# Patient Record
Sex: Female | Born: 1960 | Race: White | Hispanic: No | Marital: Married | Smoking: Never smoker
Health system: Southern US, Community
[De-identification: ages and names within clinical notes are randomized; demographics above are authoritative.]

## PROBLEM LIST (undated history)

## (undated) DIAGNOSIS — T7840XA Allergy, unspecified, initial encounter: Secondary | ICD-10-CM

## (undated) DIAGNOSIS — Z8669 Personal history of other diseases of the nervous system and sense organs: Secondary | ICD-10-CM

## (undated) DIAGNOSIS — E785 Hyperlipidemia, unspecified: Secondary | ICD-10-CM

## (undated) DIAGNOSIS — I10 Essential (primary) hypertension: Secondary | ICD-10-CM

## (undated) HISTORY — DX: Hyperlipidemia, unspecified: E78.5

## (undated) HISTORY — DX: Essential (primary) hypertension: I10

## (undated) HISTORY — DX: Allergy, unspecified, initial encounter: T78.40XA

---
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
## 1996-06-29 HISTORY — PX: OTHER SURGICAL HISTORY: SHX169

## 1998-08-07 ENCOUNTER — Ambulatory Visit (HOSPITAL_COMMUNITY): Admission: RE | Admit: 1998-08-07 | Discharge: 1998-08-07 | Payer: Self-pay | Admitting: Internal Medicine

## 1998-08-07 ENCOUNTER — Encounter: Payer: Self-pay | Admitting: Internal Medicine

## 1999-07-01 ENCOUNTER — Other Ambulatory Visit: Admission: RE | Admit: 1999-07-01 | Discharge: 1999-07-01 | Payer: Self-pay | Admitting: Internal Medicine

## 2000-08-12 ENCOUNTER — Other Ambulatory Visit: Admission: RE | Admit: 2000-08-12 | Discharge: 2000-08-12 | Payer: Self-pay | Admitting: Internal Medicine

## 2000-08-30 ENCOUNTER — Ambulatory Visit (HOSPITAL_COMMUNITY): Admission: RE | Admit: 2000-08-30 | Discharge: 2000-08-30 | Payer: Self-pay | Admitting: Internal Medicine

## 2000-08-30 ENCOUNTER — Encounter: Payer: Self-pay | Admitting: Internal Medicine

## 2001-09-08 ENCOUNTER — Other Ambulatory Visit: Admission: RE | Admit: 2001-09-08 | Discharge: 2001-09-08 | Payer: Self-pay | Admitting: Internal Medicine

## 2001-09-21 ENCOUNTER — Encounter: Payer: Self-pay | Admitting: Internal Medicine

## 2001-09-21 ENCOUNTER — Ambulatory Visit (HOSPITAL_COMMUNITY): Admission: RE | Admit: 2001-09-21 | Discharge: 2001-09-21 | Payer: Self-pay | Admitting: Internal Medicine

## 2002-09-19 ENCOUNTER — Other Ambulatory Visit: Admission: RE | Admit: 2002-09-19 | Discharge: 2002-09-19 | Payer: Self-pay | Admitting: Surgery

## 2002-10-04 ENCOUNTER — Encounter: Payer: Self-pay | Admitting: Internal Medicine

## 2002-10-04 ENCOUNTER — Ambulatory Visit (HOSPITAL_COMMUNITY): Admission: RE | Admit: 2002-10-04 | Discharge: 2002-10-04 | Payer: Self-pay | Admitting: Internal Medicine

## 2002-10-10 ENCOUNTER — Encounter: Admission: RE | Admit: 2002-10-10 | Discharge: 2002-10-31 | Payer: Self-pay | Admitting: Internal Medicine

## 2003-09-20 ENCOUNTER — Other Ambulatory Visit: Admission: RE | Admit: 2003-09-20 | Discharge: 2003-09-20 | Payer: Self-pay

## 2003-10-16 ENCOUNTER — Ambulatory Visit (HOSPITAL_COMMUNITY): Admission: RE | Admit: 2003-10-16 | Discharge: 2003-10-16 | Payer: Self-pay | Admitting: Internal Medicine

## 2003-11-12 ENCOUNTER — Encounter: Admission: RE | Admit: 2003-11-12 | Discharge: 2004-02-10 | Payer: Self-pay | Admitting: Internal Medicine

## 2004-09-29 ENCOUNTER — Other Ambulatory Visit: Admission: RE | Admit: 2004-09-29 | Discharge: 2004-09-29 | Payer: Self-pay | Admitting: Internal Medicine

## 2004-10-16 ENCOUNTER — Ambulatory Visit (HOSPITAL_COMMUNITY): Admission: RE | Admit: 2004-10-16 | Discharge: 2004-10-16 | Payer: Self-pay | Admitting: Internal Medicine

## 2005-11-30 ENCOUNTER — Ambulatory Visit (HOSPITAL_COMMUNITY): Admission: RE | Admit: 2005-11-30 | Discharge: 2005-11-30 | Payer: Self-pay | Admitting: Internal Medicine

## 2005-12-02 ENCOUNTER — Other Ambulatory Visit: Admission: RE | Admit: 2005-12-02 | Discharge: 2005-12-02 | Payer: Self-pay | Admitting: Internal Medicine

## 2006-02-01 ENCOUNTER — Other Ambulatory Visit: Admission: RE | Admit: 2006-02-01 | Discharge: 2006-02-01 | Payer: Self-pay | Admitting: Internal Medicine

## 2006-06-29 HISTORY — PX: INTRAUTERINE DEVICE INSERTION: SHX323

## 2006-12-06 ENCOUNTER — Other Ambulatory Visit: Admission: RE | Admit: 2006-12-06 | Discharge: 2006-12-06 | Payer: Self-pay | Admitting: *Deleted

## 2007-01-03 ENCOUNTER — Ambulatory Visit (HOSPITAL_COMMUNITY): Admission: RE | Admit: 2007-01-03 | Discharge: 2007-01-03 | Payer: Self-pay | Admitting: *Deleted

## 2007-01-06 ENCOUNTER — Encounter: Admission: RE | Admit: 2007-01-06 | Discharge: 2007-01-06 | Payer: Self-pay | Admitting: *Deleted

## 2007-12-20 ENCOUNTER — Other Ambulatory Visit: Admission: RE | Admit: 2007-12-20 | Discharge: 2007-12-20 | Payer: Self-pay | Admitting: Family Medicine

## 2008-01-06 ENCOUNTER — Ambulatory Visit (HOSPITAL_COMMUNITY): Admission: RE | Admit: 2008-01-06 | Discharge: 2008-01-06 | Payer: Self-pay | Admitting: Family Medicine

## 2008-02-22 ENCOUNTER — Emergency Department (HOSPITAL_COMMUNITY): Admission: EM | Admit: 2008-02-22 | Discharge: 2008-02-22 | Payer: Self-pay | Admitting: Emergency Medicine

## 2008-02-26 ENCOUNTER — Emergency Department (HOSPITAL_COMMUNITY): Admission: EM | Admit: 2008-02-26 | Discharge: 2008-02-26 | Payer: Self-pay | Admitting: Family Medicine

## 2008-09-19 ENCOUNTER — Emergency Department (HOSPITAL_COMMUNITY): Admission: EM | Admit: 2008-09-19 | Discharge: 2008-09-19 | Payer: Self-pay | Admitting: Family Medicine

## 2009-01-08 ENCOUNTER — Ambulatory Visit (HOSPITAL_COMMUNITY): Admission: RE | Admit: 2009-01-08 | Discharge: 2009-01-08 | Payer: Self-pay | Admitting: Family Medicine

## 2009-03-18 ENCOUNTER — Other Ambulatory Visit: Admission: RE | Admit: 2009-03-18 | Discharge: 2009-03-18 | Payer: Self-pay | Admitting: Family Medicine

## 2009-11-29 ENCOUNTER — Emergency Department (HOSPITAL_COMMUNITY): Admission: EM | Admit: 2009-11-29 | Discharge: 2009-11-29 | Payer: Self-pay | Admitting: Emergency Medicine

## 2010-01-28 ENCOUNTER — Ambulatory Visit (HOSPITAL_COMMUNITY): Admission: RE | Admit: 2010-01-28 | Discharge: 2010-01-28 | Payer: Self-pay | Admitting: Family Medicine

## 2010-04-17 ENCOUNTER — Other Ambulatory Visit: Admission: RE | Admit: 2010-04-17 | Discharge: 2010-04-17 | Payer: Self-pay | Admitting: *Deleted

## 2010-12-30 ENCOUNTER — Inpatient Hospital Stay (INDEPENDENT_AMBULATORY_CARE_PROVIDER_SITE_OTHER)
Admission: RE | Admit: 2010-12-30 | Discharge: 2010-12-30 | Disposition: A | Discharge status: Home / Self Care | Payer: 59 | Source: Ambulatory Visit | Attending: Family Medicine | Admitting: Family Medicine

## 2010-12-30 DIAGNOSIS — L03019 Cellulitis of unspecified finger: Secondary | ICD-10-CM

## 2011-01-03 LAB — CULTURE, ROUTINE-ABSCESS: Gram Stain: NONE SEEN

## 2011-01-23 ENCOUNTER — Other Ambulatory Visit (HOSPITAL_COMMUNITY): Payer: Self-pay | Admitting: Family Medicine

## 2011-01-23 DIAGNOSIS — Z1231 Encounter for screening mammogram for malignant neoplasm of breast: Secondary | ICD-10-CM

## 2011-02-10 ENCOUNTER — Ambulatory Visit (HOSPITAL_COMMUNITY)
Admission: RE | Admit: 2011-02-10 | Discharge: 2011-02-10 | Disposition: A | Discharge status: Home / Self Care | Payer: 59 | Source: Ambulatory Visit | Attending: Family Medicine | Admitting: Family Medicine

## 2011-02-10 DIAGNOSIS — Z1231 Encounter for screening mammogram for malignant neoplasm of breast: Secondary | ICD-10-CM | POA: Insufficient documentation

## 2011-07-20 ENCOUNTER — Other Ambulatory Visit: Payer: Self-pay | Admitting: Gastroenterology

## 2011-08-03 ENCOUNTER — Other Ambulatory Visit (HOSPITAL_COMMUNITY): Payer: Self-pay | Admitting: Gastroenterology

## 2011-08-03 DIAGNOSIS — R9389 Abnormal findings on diagnostic imaging of other specified body structures: Secondary | ICD-10-CM

## 2011-08-04 ENCOUNTER — Encounter (HOSPITAL_COMMUNITY): Payer: Self-pay

## 2011-08-04 ENCOUNTER — Ambulatory Visit (HOSPITAL_COMMUNITY)
Admission: RE | Admit: 2011-08-04 | Discharge: 2011-08-04 | Disposition: A | Discharge status: Home / Self Care | Payer: 59 | Source: Ambulatory Visit | Attending: Gastroenterology | Admitting: Gastroenterology

## 2011-08-04 DIAGNOSIS — R9389 Abnormal findings on diagnostic imaging of other specified body structures: Secondary | ICD-10-CM

## 2011-08-04 DIAGNOSIS — K7689 Other specified diseases of liver: Secondary | ICD-10-CM | POA: Insufficient documentation

## 2011-08-04 DIAGNOSIS — K639 Disease of intestine, unspecified: Secondary | ICD-10-CM | POA: Insufficient documentation

## 2011-08-04 MED ORDER — IOHEXOL 300 MG/ML  SOLN: 80.0000 mL | Freq: Once | INTRAMUSCULAR | Status: AC | PRN

## 2011-08-05 ENCOUNTER — Other Ambulatory Visit (HOSPITAL_COMMUNITY): Payer: 59

## 2011-08-06 ENCOUNTER — Ambulatory Visit (INDEPENDENT_AMBULATORY_CARE_PROVIDER_SITE_OTHER): Payer: Commercial Managed Care - PPO | Admitting: General Surgery

## 2011-08-06 ENCOUNTER — Encounter (INDEPENDENT_AMBULATORY_CARE_PROVIDER_SITE_OTHER): Payer: Self-pay | Admitting: General Surgery

## 2011-08-06 VITALS — BP 112/68 | HR 70 | Resp 18 | Ht 64.0 in | Wt 180.0 lb

## 2011-08-06 DIAGNOSIS — K6389 Other specified diseases of intestine: Secondary | ICD-10-CM

## 2011-08-06 NOTE — Progress Notes (Addendum)
Patient ID: Connie Gonzales, female   DOB: 07/09/1960, 51 y.o.   MRN: [id number]  Chief Complaint  Patient presents with  . Other    Eval colon mass    HPI Connie Gonzales is a 51 y.o. female.  Referred by Dr. 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 She underwent her first screening colonoscopy with absolutely no symptoms referable to her bowels recently. On her colonoscopy she was noted to have a 6 mm polyp in the descending colon and a 4 mm polyp in the sigmoid colon that were resected and are both tubular adenomas. There was an everted appendix that was noted as well and this underwent biopsy. showing benign colonic mucosa with melanosis coli. Following that she was then sent for a CT scan. Her CT scan shows a mass involving the cecum surrounding but not involving the fatty ileocecal valve and extending to the cecal tip measuring 3.9 x 3.7 x 6.7 cm. There is no evidence of a bowel obstruction. The appendix is not apparent. There were some mildly enlarged lymph nodes in the pericecal region as well. There appears to be a cyst in the liver as well. She comes in today to discuss her options. She is actually no symptoms referable to her bowels.  Past Medical History  Diagnosis Date  . Allergy   . Hypertension   . Hyperlipidemia   migraines   Past Surgical History  Procedure Date  . Wisdom teeth removal 1979  . Cyst removal from leg 1998    History reviewed. No pertinent family history.  Social History History  Substance Use Topics  . Smoking status: Never Smoker   . Smokeless tobacco: Not on file  . Alcohol Use: Yes    Allergies  Allergen Reactions  . Augmentin Other (See Comments)    upstet stomach    Current Outpatient Prescriptions  Medication Sig Dispense Refill  . atenolol (TENORMIN) 25 MG tablet Take 25 mg by mouth daily.      . simvastatin (ZOCOR) 40 MG tablet Take 40 mg by mouth every evening.        Occasional Relpax for migraines  Review of Systems Review of Systems  Constitutional: Negative for fever, chills and unexpected weight change.  HENT: Negative for hearing loss, congestion, sore throat, trouble swallowing and voice change.   Eyes: Negative for visual disturbance.  Respiratory: Negative for cough and wheezing.   Cardiovascular: Negative for chest pain, palpitations and leg swelling.  Gastrointestinal: Negative for nausea, vomiting, abdominal pain, diarrhea, constipation, blood in stool, abdominal distention and anal bleeding.  Genitourinary: Negative for hematuria, vaginal bleeding and difficulty urinating.  Musculoskeletal: Negative for arthralgias.  Skin: Negative for rash and wound.  Neurological: Negative for seizures, syncope and headaches.  Hematological: Negative for adenopathy. Does not bruise/bleed easily.  Psychiatric/Behavioral: Negative for confusion.    Blood pressure 112/68, pulse 70, resp. rate 18, height 5' 4" (1.626 m), weight 180 lb (81.647 kg).  Physical Exam Physical Exam  Vitals reviewed. Constitutional: She appears well-developed and well-nourished.  Eyes: No scleral icterus.  Neck: Neck supple.  Cardiovascular: Normal rate, regular rhythm and normal heart sounds.   Pulmonary/Chest: Effort normal and breath sounds normal. She has no wheezes. She has no rales.  Abdominal: Soft. Bowel sounds are normal. She exhibits no mass. There is no hepatomegaly. There is no tenderness.  Lymphadenopathy:    She has no cervical adenopathy.      Data Reviewed Colonoscopy reviewed  CT ABDOMEN AND PELVIS WITH CONTRAST 08/04/2011:  Technique: Multidetector CT imaging of the abdomen and pelvis was  performed following the standard protocol during bolus  administration of intravenous contrast.  Contrast: 80 ml Omnipaque-300 IV. Oral contrast was also  administered.  Comparison: None.  Findings: Mass involving the cecum, surrounding but not involving  the  fatty ileocecal valve and extending to the cecal tip, measuring  approximately 3.9 x 3.7 x 6.7 cm (series 2, image 58 and coronal  image 48). No evidence of small bowel obstruction by the mass.  Remainder of the colon normal in appearance. A normal appendix is  not identified. Mildly enlarged lymph nodes in the pericecal  region adjacent to the mass. No significant lymphadenopathy  elsewhere.  Vague low attenuation focus in the liver near the dome (series 2,  image 12 and coronal image 63). Liver otherwise normal in  appearance. Normal appearing spleen, pancreas, adrenal glands, and  kidneys. Gallbladder unremarkable by CT. No biliary ductal  dilation. No visible aorto-iliofemoral atherosclerosis.  Stomach and small bowel normal in appearance. No ascites. IUD  within the normal appearing uterus. Normal-appearing ovaries by  CT. No free pelvic fluid. Urinary bladder decompressed and  unremarkable. Pelvic phleboliths. Bone window images demonstrate  degenerative changes involving the lower thoracic spine and the  facet joints of the lower lumbar spine. Visualized lung bases  clear apart from the expected dependent atelectasis posteriorly.  Heart size upper normal.  IMPRESSION:  1. Mass involving the cecum, surrounding the ileocecal valve  without valve involvement. No associated small bowel distention.  If a submucosal as per history on colonoscopy, this could represent  a large endometrioma or an appendiceal myxoma which has migrated  superiorly. A normal-appearing appendix was not identified.  2. Numerous mildly enlarged mesenteric lymph nodes adjacent to the  cecal mass. No significant lymphadenopathy elsewhere.  3. Probable cyst in the liver near the dome, given its conspicuity  for its small size.   Assessment    Right colon mass    Plan    I had a long discussion with her and her husband today. We reviewed her CT scan. There is a fairly large right colon mass. The biopsy is  really inconclusive. It does appear that this is not a mucosal-based lesion and may very well be stemming from her appendix. There is certainly a concern for malignancy in any adenopathy associated with it as well. I think that the only option is really to resect this area. I recommended to him a laparoscopic assisted right colectomy. We discussed the option of an ileocecectomy I think that that would not be the best choice given the fact that I am fairly concerned that this is a tumor. We discussed the pathophysiology of the colon. I showed him a diagram which part of the colon would be resected with this operation. We discussed the risks of the operation including bleeding, and a 20% chance of infection, intra-abdominal abscess requiring drainage, anastomotic leak requiring reoperation, DVT, pulmonary embolus, further cardiac and pulmonary complications. We discussed that if this is a cancer we refer her afterwards for any other treatment that might be indicated. I spent about an hour discussing this with him as well as the different options and the risks associated with the procedure. She has a cruise that she's going on with her family in about 4 weeks so the choice would be to be due to his very soon with a small chance   of missing her cruise or weight until afterwards. She very much would like to do this beforehand so I will try to schedule her soon. I will plan on giving her entereg, Invanz, subcutaneous heparin, and a bowel prep preoperatively.       Tari Lecount 08/06/2011, 11:30 AM    

## 2011-08-07 ENCOUNTER — Encounter (HOSPITAL_COMMUNITY): Payer: Self-pay

## 2011-08-07 ENCOUNTER — Other Ambulatory Visit: Payer: Self-pay

## 2011-08-07 ENCOUNTER — Ambulatory Visit (HOSPITAL_COMMUNITY)
Admission: RE | Admit: 2011-08-07 | Discharge: 2011-08-07 | Disposition: A | Discharge status: Home / Self Care | Payer: 59 | Source: Ambulatory Visit | Attending: General Surgery | Admitting: General Surgery

## 2011-08-07 ENCOUNTER — Encounter (HOSPITAL_COMMUNITY)
Admission: RE | Admit: 2011-08-07 | Discharge: 2011-08-07 | Disposition: A | Discharge status: Home / Self Care | Payer: 59 | Source: Ambulatory Visit | Attending: General Surgery | Admitting: General Surgery

## 2011-08-07 DIAGNOSIS — Z01810 Encounter for preprocedural cardiovascular examination: Secondary | ICD-10-CM | POA: Insufficient documentation

## 2011-08-07 DIAGNOSIS — Z01818 Encounter for other preprocedural examination: Secondary | ICD-10-CM | POA: Insufficient documentation

## 2011-08-07 DIAGNOSIS — Z01812 Encounter for preprocedural laboratory examination: Secondary | ICD-10-CM | POA: Insufficient documentation

## 2011-08-07 HISTORY — DX: Personal history of other diseases of the nervous system and sense organs: Z86.69

## 2011-08-07 LAB — COMPREHENSIVE METABOLIC PANEL
ALT: 21 U/L (ref 0–35)
AST: 16 U/L (ref 0–37)
CO2: 30 mEq/L (ref 19–32)
Chloride: 102 mEq/L (ref 96–112)
GFR calc non Af Amer: >90 mL/min (ref >90)
Sodium: 140 mEq/L (ref 135–145)
Total Bilirubin: 0.4 mg/dL (ref 0.3–1.2)

## 2011-08-07 LAB — CBC
MCH: 31.6 pg (ref 26.0–34.0)
MCHC: 34.5 g/dL (ref 30.0–36.0)
MCV: 91.5 fL (ref 78.0–100.0)
Platelets: 238 10*3/uL (ref 150–400)
RDW: 12 % (ref 11.5–15.5)

## 2011-08-07 LAB — CEA: CEA: <0.5 ng/mL (ref 0.0–5.0)

## 2011-08-07 NOTE — Pre-Procedure Instructions (Signed)
20 Jhania M [place]le[address]  08/07/2011   Your procedure is scheduled on:  Tues,Feb 12  Report to [facility] at 0900 AM.  Call this number if you have problems the morning of surgery: [phone]   Remember:   Do not eat food:After Midnight.  May have clear liquids: up to 4 Hours before arrival.  Clear liquids include soda, tea, black coffee, apple or grape juice, broth.  Take these medicines the morning of surgery with A SIP OF WATER: **Atenolol*   Do not wear jewelry, make-up or nail polish.  Do not wear lotions, powders, or perfumes. You may wear deodorant.  Do not shave 48 hours prior to surgery.  Do not bring valuables to the hospital.  Contacts, dentures or bridgework may not be worn into surgery.  Leave suitcase in the car. After surgery it may be brought to your room.  For patients admitted to the hospital, checkout time is 11:00 AM the day of discharge.   Patients discharged the day of surgery will not be allowed to drive home.  Name and phone number of your driver: *n/a**  Special Instructions: CHG Shower Use Special Wash: 1/2 bottle night before surgery and 1/2 bottle morning of surgery.   Please read over the following fact sheets that you were given: Pain Booklet, Coughing and Deep Breathing, MRSA Information and Surgical Site Infection Prevention

## 2011-08-10 ENCOUNTER — Encounter (HOSPITAL_COMMUNITY): Payer: Self-pay | Admitting: Pharmacy Technician

## 2011-08-10 MED ORDER — HEPARIN SODIUM (PORCINE) 5000 UNIT/ML IJ SOLN
5000.0000 [IU] | Freq: Once | INTRAMUSCULAR | Status: AC
  Administered 2011-08-11: 5000 [IU] via SUBCUTANEOUS
  Filled 2011-08-10: qty 1

## 2011-08-10 MED ORDER — SODIUM CHLORIDE 0.9 % IV SOLN
1.0000 g | INTRAVENOUS | Status: AC
  Administered 2011-08-11: 1 g via INTRAVENOUS
  Filled 2011-08-10: qty 1

## 2011-08-10 MED ORDER — ALVIMOPAN 12 MG PO CAPS
12.0000 mg | ORAL_CAPSULE | Freq: Once | ORAL | Status: AC
  Administered 2011-08-11: 12 mg via ORAL
  Filled 2011-08-10: qty 1

## 2011-08-11 ENCOUNTER — Other Ambulatory Visit (INDEPENDENT_AMBULATORY_CARE_PROVIDER_SITE_OTHER): Payer: Self-pay | Admitting: General Surgery

## 2011-08-11 ENCOUNTER — Inpatient Hospital Stay (HOSPITAL_COMMUNITY)
Admission: RE | Admit: 2011-08-11 | Discharge: 2011-08-15 | DRG: 331 | Disposition: A | Discharge status: Home / Self Care | Payer: 59 | Source: Ambulatory Visit | Attending: General Surgery | Admitting: General Surgery

## 2011-08-11 ENCOUNTER — Encounter (HOSPITAL_COMMUNITY): Payer: Self-pay | Admitting: Anesthesiology

## 2011-08-11 ENCOUNTER — Encounter (HOSPITAL_COMMUNITY)
Admission: RE | Disposition: A | Discharge status: Home / Self Care | Payer: Self-pay | Source: Ambulatory Visit | Attending: General Surgery

## 2011-08-11 ENCOUNTER — Ambulatory Visit (HOSPITAL_COMMUNITY): Payer: 59 | Admitting: Anesthesiology

## 2011-08-11 DIAGNOSIS — I10 Essential (primary) hypertension: Secondary | ICD-10-CM | POA: Diagnosis present

## 2011-08-11 DIAGNOSIS — Z79899 Other long term (current) drug therapy: Secondary | ICD-10-CM

## 2011-08-11 DIAGNOSIS — E785 Hyperlipidemia, unspecified: Secondary | ICD-10-CM | POA: Diagnosis present

## 2011-08-11 DIAGNOSIS — Z883 Allergy status to other anti-infective agents status: Secondary | ICD-10-CM

## 2011-08-11 DIAGNOSIS — Z01812 Encounter for preprocedural laboratory examination: Secondary | ICD-10-CM

## 2011-08-11 DIAGNOSIS — D490 Neoplasm of unspecified behavior of digestive system: Principal | ICD-10-CM | POA: Diagnosis present

## 2011-08-11 DIAGNOSIS — N805 Endometriosis of intestine: Secondary | ICD-10-CM

## 2011-08-11 HISTORY — PX: COLON SURGERY: SHX602

## 2011-08-11 SURGERY — COLECTOMY, RIGHT, LAPAROSCOPIC
Anesthesia: General | Site: Abdomen | Laterality: Right

## 2011-08-11 MED ORDER — ROCURONIUM BROMIDE 100 MG/10ML IV SOLN
INTRAVENOUS | Status: DC | PRN
  Administered 2011-08-11: 50 mg via INTRAVENOUS

## 2011-08-11 MED ORDER — NEOSTIGMINE METHYLSULFATE 1 MG/ML IJ SOLN
INTRAMUSCULAR | Status: DC | PRN
  Administered 2011-08-11: 5 mg via INTRAVENOUS

## 2011-08-11 MED ORDER — SODIUM CHLORIDE 0.9 % IR SOLN
Status: DC | PRN
  Administered 2011-08-11: 1000 mL

## 2011-08-11 MED ORDER — ONDANSETRON HCL 4 MG/2ML IJ SOLN
4.0000 mg | Freq: Four times a day (QID) | INTRAMUSCULAR | Status: AC | PRN
  Administered 2011-08-11: 4 mg via INTRAVENOUS

## 2011-08-11 MED ORDER — LIDOCAINE HCL (CARDIAC) 20 MG/ML IV SOLN
INTRAVENOUS | Status: DC | PRN
  Administered 2011-08-11: 50 mg via INTRAVENOUS

## 2011-08-11 MED ORDER — DROPERIDOL 2.5 MG/ML IJ SOLN
INTRAMUSCULAR | Status: DC | PRN
  Administered 2011-08-11: 0.625 mg via INTRAVENOUS

## 2011-08-11 MED ORDER — LACTATED RINGERS IV SOLN
INTRAVENOUS | Status: DC | PRN
  Administered 2011-08-11 (×2): via INTRAVENOUS

## 2011-08-11 MED ORDER — GLYCOPYRROLATE 0.2 MG/ML IJ SOLN
INTRAMUSCULAR | Status: DC | PRN
  Administered 2011-08-11: .6 mg via INTRAVENOUS

## 2011-08-11 MED ORDER — DIPHENHYDRAMINE HCL 12.5 MG/5ML PO ELIX
12.5000 mg | ORAL_SOLUTION | Freq: Four times a day (QID) | ORAL | Status: DC | PRN
  Filled 2011-08-11: qty 5

## 2011-08-11 MED ORDER — PHENYLEPHRINE HCL 10 MG/ML IJ SOLN
INTRAMUSCULAR | Status: DC | PRN
  Administered 2011-08-11 (×3): 40 ug via INTRAVENOUS

## 2011-08-11 MED ORDER — ATENOLOL 25 MG PO TABS
25.0000 mg | ORAL_TABLET | Freq: Every day | ORAL | Status: DC
  Administered 2011-08-12 – 2011-08-15 (×4): 25 mg via ORAL
  Filled 2011-08-11 (×5): qty 1

## 2011-08-11 MED ORDER — PROPOFOL 10 MG/ML IV EMUL
INTRAVENOUS | Status: DC | PRN
  Administered 2011-08-11: 180 mg via INTRAVENOUS

## 2011-08-11 MED ORDER — NALOXONE HCL 0.4 MG/ML IJ SOLN
0.4000 mg | INTRAMUSCULAR | Status: DC | PRN
  Filled 2011-08-11: qty 1

## 2011-08-11 MED ORDER — ALVIMOPAN 12 MG PO CAPS
12.0000 mg | ORAL_CAPSULE | Freq: Once | ORAL | Status: DC
  Filled 2011-08-11: qty 1

## 2011-08-11 MED ORDER — ACETAMINOPHEN 325 MG PO TABS
650.0000 mg | ORAL_TABLET | Freq: Four times a day (QID) | ORAL | Status: DC | PRN
  Administered 2011-08-12 – 2011-08-15 (×4): 650 mg via ORAL
  Filled 2011-08-11 (×5): qty 2

## 2011-08-11 MED ORDER — SODIUM CHLORIDE 0.9 % IV SOLN
INTRAVENOUS | Status: DC
  Administered 2011-08-11 – 2011-08-14 (×6): via INTRAVENOUS

## 2011-08-11 MED ORDER — HETASTARCH-ELECTROLYTES 6 % IV SOLN
INTRAVENOUS | Status: DC | PRN
  Administered 2011-08-11: 13:00:00 via INTRAVENOUS

## 2011-08-11 MED ORDER — MIDAZOLAM HCL 5 MG/5ML IJ SOLN
INTRAMUSCULAR | Status: DC | PRN
  Administered 2011-08-11: 2 mg via INTRAVENOUS

## 2011-08-11 MED ORDER — SUFENTANIL CITRATE 50 MCG/ML IV SOLN
INTRAVENOUS | Status: DC | PRN
  Administered 2011-08-11: 5 ug via INTRAVENOUS
  Administered 2011-08-11 (×2): 10 ug via INTRAVENOUS
  Administered 2011-08-11: 5 ug via INTRAVENOUS

## 2011-08-11 MED ORDER — 0.9 % SODIUM CHLORIDE (POUR BTL) OPTIME
TOPICAL | Status: DC | PRN
  Administered 2011-08-11: 2000 mL

## 2011-08-11 MED ORDER — ONDANSETRON HCL 4 MG/2ML IJ SOLN: 4.0000 mg | Freq: Four times a day (QID) | INTRAMUSCULAR | Status: DC | PRN

## 2011-08-11 MED ORDER — SODIUM CHLORIDE 0.9 % IJ SOLN: 9.0000 mL | INTRAMUSCULAR | Status: DC | PRN

## 2011-08-11 MED ORDER — VECURONIUM BROMIDE 10 MG IV SOLR
INTRAVENOUS | Status: DC | PRN
  Administered 2011-08-11: 2 mg via INTRAVENOUS
  Administered 2011-08-11: 1 mg via INTRAVENOUS

## 2011-08-11 MED ORDER — LIDOCAINE HCL 4 % MT SOLN
OROMUCOSAL | Status: DC | PRN
  Administered 2011-08-11: 2 mL via TOPICAL

## 2011-08-11 MED ORDER — ALVIMOPAN 12 MG PO CAPS
12.0000 mg | ORAL_CAPSULE | Freq: Two times a day (BID) | ORAL | Status: DC
  Administered 2011-08-12 – 2011-08-15 (×7): 12 mg via ORAL
  Filled 2011-08-11 (×9): qty 1

## 2011-08-11 MED ORDER — DIPHENHYDRAMINE HCL 50 MG/ML IJ SOLN
12.5000 mg | Freq: Four times a day (QID) | INTRAMUSCULAR | Status: DC | PRN
  Filled 2011-08-11: qty 0.25

## 2011-08-11 MED ORDER — ACETAMINOPHEN 10 MG/ML IV SOLN
INTRAVENOUS | Status: AC
  Filled 2011-08-11: qty 100

## 2011-08-11 MED ORDER — HEPARIN SODIUM (PORCINE) 5000 UNIT/ML IJ SOLN
5000.0000 [IU] | Freq: Three times a day (TID) | INTRAMUSCULAR | Status: DC
  Administered 2011-08-11 – 2011-08-13 (×5): 5000 [IU] via SUBCUTANEOUS
  Filled 2011-08-11 (×8): qty 1

## 2011-08-11 MED ORDER — ONDANSETRON HCL 4 MG/2ML IJ SOLN
INTRAMUSCULAR | Status: DC | PRN
  Administered 2011-08-11: 4 mg via INTRAVENOUS

## 2011-08-11 MED ORDER — ELETRIPTAN HYDROBROMIDE 40 MG PO TABS
40.0000 mg | ORAL_TABLET | ORAL | Status: DC | PRN
  Administered 2011-08-12 – 2011-08-14 (×3): 40 mg via ORAL
  Filled 2011-08-11 (×4): qty 1

## 2011-08-11 MED ORDER — BUPIVACAINE HCL (PF) 0.25 % IJ SOLN
INTRAMUSCULAR | Status: DC | PRN
  Administered 2011-08-11: 8 mL

## 2011-08-11 MED ORDER — DEXAMETHASONE SODIUM PHOSPHATE 10 MG/ML IJ SOLN
INTRAMUSCULAR | Status: DC | PRN
  Administered 2011-08-11: 8 mg via INTRAVENOUS

## 2011-08-11 MED ORDER — ACETAMINOPHEN 10 MG/ML IV SOLN
INTRAVENOUS | Status: DC | PRN
  Administered 2011-08-11: 1000 mg via INTRAVENOUS

## 2011-08-11 MED ORDER — HYDROMORPHONE HCL PF 1 MG/ML IJ SOLN
0.2500 mg | INTRAMUSCULAR | Status: DC | PRN
Start: 2011-08-11 — End: 2011-08-11
  Administered 2011-08-11 (×2): 0.25 mg via INTRAVENOUS

## 2011-08-11 MED ORDER — MORPHINE SULFATE (PF) 1 MG/ML IV SOLN
INTRAVENOUS | Status: DC
  Administered 2011-08-11: 8 mg via INTRAVENOUS
  Administered 2011-08-11: 14:00:00 via INTRAVENOUS
  Administered 2011-08-12: 2 mg via INTRAVENOUS
  Administered 2011-08-12: 16:00:00 via INTRAVENOUS
  Administered 2011-08-12: 4 mg via INTRAVENOUS
  Administered 2011-08-12: 2 mg via INTRAVENOUS
  Administered 2011-08-12: 4 mg via INTRAVENOUS
  Administered 2011-08-12: 3 mg via INTRAVENOUS
  Administered 2011-08-12: 1 mg via INTRAVENOUS

## 2011-08-11 MED ORDER — ONDANSETRON HCL 4 MG/2ML IJ SOLN
4.0000 mg | Freq: Four times a day (QID) | INTRAMUSCULAR | Status: DC | PRN
  Filled 2011-08-11: qty 2

## 2011-08-11 MED ORDER — ACETAMINOPHEN 650 MG RE SUPP: 650.0000 mg | Freq: Four times a day (QID) | RECTAL | Status: DC | PRN

## 2011-08-11 MED ORDER — KETOROLAC TROMETHAMINE 15 MG/ML IJ SOLN
15.0000 mg | Freq: Once | INTRAMUSCULAR | Status: AC
  Administered 2011-08-11: 15 mg via INTRAVENOUS
  Filled 2011-08-11: qty 1

## 2011-08-11 MED ORDER — LACTATED RINGERS IV SOLN
INTRAVENOUS | Status: DC
  Administered 2011-08-11: 11:00:00 via INTRAVENOUS

## 2011-08-11 SURGICAL SUPPLY — 77 items
ADH SKN CLS APL DERMABOND .7 (GAUZE/BANDAGES/DRESSINGS)
APPLIER CLIP 5 13 M/L LIGAMAX5 (MISCELLANEOUS)
APR CLP MED LRG 5 ANG JAW (MISCELLANEOUS)
BANDAGE ELASTIC 3 VELCRO NS (GAUZE/BANDAGES/DRESSINGS) ×1 IMPLANT
BLADE SURG 10 STRL SS (BLADE) ×2 IMPLANT
BLADE SURG ROTATE 9660 (MISCELLANEOUS) IMPLANT
CANISTER SUCTION 2500CC (MISCELLANEOUS) ×2 IMPLANT
CELLS DAT CNTRL 66122 CELL SVR (MISCELLANEOUS) IMPLANT
CHLORAPREP W/TINT 26ML (MISCELLANEOUS) ×2 IMPLANT
CLIP APPLIE 5 13 M/L LIGAMAX5 (MISCELLANEOUS) IMPLANT
CLOTH BEACON ORANGE TIMEOUT ST (SAFETY) ×2 IMPLANT
COVER SURGICAL LIGHT HANDLE (MISCELLANEOUS) ×2 IMPLANT
DERMABOND ADVANCED (GAUZE/BANDAGES/DRESSINGS)
DERMABOND ADVANCED .7 DNX12 (GAUZE/BANDAGES/DRESSINGS) ×1 IMPLANT
DRAPE WARM FLUID 44X44 (DRAPE) ×2 IMPLANT
DRSG COVADERM 4X8 (GAUZE/BANDAGES/DRESSINGS) ×1 IMPLANT
ELECT BLADE 6.5 EXT (BLADE) IMPLANT
ELECT CAUTERY BLADE 6.4 (BLADE) ×3 IMPLANT
ELECT REM PT RETURN 9FT ADLT (ELECTROSURGICAL) ×2
ELECTRODE REM PT RTRN 9FT ADLT (ELECTROSURGICAL) ×1 IMPLANT
GEL ULTRASOUND 20GR AQUASONIC (MISCELLANEOUS) IMPLANT
GLOVE BIO SURGEON STRL SZ7 (GLOVE) ×4 IMPLANT
GLOVE BIO SURGEON STRL SZ7.5 (GLOVE) ×2 IMPLANT
GLOVE BIO SURGEON STRL SZ8 (GLOVE) ×2 IMPLANT
GLOVE BIOGEL PI IND STRL 7.0 (GLOVE) IMPLANT
GLOVE BIOGEL PI IND STRL 7.5 (GLOVE) ×1 IMPLANT
GLOVE BIOGEL PI IND STRL 8 (GLOVE) IMPLANT
GLOVE BIOGEL PI INDICATOR 7.0 (GLOVE) ×1
GLOVE BIOGEL PI INDICATOR 7.5 (GLOVE) ×2
GLOVE BIOGEL PI INDICATOR 8 (GLOVE) ×1
GLOVE ECLIPSE 6.5 STRL STRAW (GLOVE) ×1 IMPLANT
GLOVE SURG SS PI 7.5 STRL IVOR (GLOVE) ×1 IMPLANT
GOWN STRL NON-REIN LRG LVL3 (GOWN DISPOSABLE) ×9 IMPLANT
KIT BASIN OR (CUSTOM PROCEDURE TRAY) ×2 IMPLANT
KIT ROOM TURNOVER OR (KITS) ×2 IMPLANT
LIGASURE IMPACT 36 18CM CVD LR (INSTRUMENTS) IMPLANT
NS IRRIG 1000ML POUR BTL (IV SOLUTION) ×2 IMPLANT
PAD ARMBOARD 7.5X6 YLW CONV (MISCELLANEOUS) ×4 IMPLANT
PENCIL BUTTON HOLSTER BLD 10FT (ELECTRODE) ×2 IMPLANT
RELOAD PROXIMATE 75MM BLUE (ENDOMECHANICALS) ×4 IMPLANT
RELOAD STAPLE 75 3.8 BLU REG (ENDOMECHANICALS) IMPLANT
RETRACTOR WND ALEXIS 18 MED (MISCELLANEOUS) ×1 IMPLANT
RTRCTR WOUND ALEXIS 18CM MED (MISCELLANEOUS)
SCALPEL HARMONIC ACE (MISCELLANEOUS) ×1 IMPLANT
SCISSORS LAP 5X35 DISP (ENDOMECHANICALS) ×1 IMPLANT
SET IRRIG TUBING LAPAROSCOPIC (IRRIGATION / IRRIGATOR) ×2 IMPLANT
SLEEVE ENDOPATH XCEL 5M (ENDOMECHANICALS) ×4 IMPLANT
SPECIMEN JAR LARGE (MISCELLANEOUS) ×2 IMPLANT
SPONGE GAUZE 4X4 12PLY (GAUZE/BANDAGES/DRESSINGS) ×1 IMPLANT
SPONGE LAP 18X18 X RAY DECT (DISPOSABLE) ×1 IMPLANT
STAPLER GUN LINEAR PROX 60 (STAPLE) ×1 IMPLANT
STAPLER PROXIMATE 75MM BLUE (STAPLE) ×1 IMPLANT
STAPLER VISISTAT 35W (STAPLE) ×2 IMPLANT
SUCTION POOLE TIP (SUCTIONS) IMPLANT
SUT PDS AB 1 TP1 54 (SUTURE) ×2 IMPLANT
SUT PDS AB 1 TP1 96 (SUTURE) IMPLANT
SUT SILK 0 TIES 10X30 (SUTURE) IMPLANT
SUT SILK 2 0 (SUTURE) ×2
SUT SILK 2 0 SH CR/8 (SUTURE) ×2 IMPLANT
SUT SILK 2-0 18XBRD TIE 12 (SUTURE) IMPLANT
SUT SILK 3 0 (SUTURE) ×2
SUT SILK 3 0 SH CR/8 (SUTURE) ×3 IMPLANT
SUT SILK 3-0 18XBRD TIE 12 (SUTURE) IMPLANT
SUT VIC AB 3-0 SH 8-18 (SUTURE) IMPLANT
SYS LAPSCP GELPORT 120MM (MISCELLANEOUS) ×2
SYSTEM LAPSCP GELPORT 120MM (MISCELLANEOUS) IMPLANT
TOWEL OR 17X24 6PK STRL BLUE (TOWEL DISPOSABLE) ×2 IMPLANT
TOWEL OR 17X26 10 PK STRL BLUE (TOWEL DISPOSABLE) ×2 IMPLANT
TRAY FOLEY CATH 14FRSI W/METER (CATHETERS) ×2 IMPLANT
TRAY LAPAROSCOPIC (CUSTOM PROCEDURE TRAY) ×2 IMPLANT
TROCAR XCEL BLUNT TIP 100MML (ENDOMECHANICALS) IMPLANT
TROCAR XCEL NON-BLD 11X100MML (ENDOMECHANICALS) ×1 IMPLANT
TROCAR XCEL NON-BLD 5MMX100MML (ENDOMECHANICALS) ×2 IMPLANT
TUBE CONNECTING 12X1/4 (SUCTIONS) ×2 IMPLANT
TUBING FILTER THERMOFLATOR (ELECTROSURGICAL) ×2 IMPLANT
WATER STERILE IRR 1000ML POUR (IV SOLUTION) ×1 IMPLANT
YANKAUER SUCT BULB TIP NO VENT (SUCTIONS) ×3 IMPLANT

## 2011-08-11 NOTE — Preoperative (Signed)
Beta Blockers   Reason not to administer Beta Blockers:Not Applicable 

## 2011-08-11 NOTE — Anesthesia Preprocedure Evaluation (Addendum)
Anesthesia Evaluation  Patient identified by MRN, date of birth, ID band Patient awake    Reviewed: Allergy & Precautions, H&P , NPO status , Patient's Chart, lab work & pertinent test results  Airway Mallampati: II  Neck ROM: full    Dental   Pulmonary          Cardiovascular hypertension, Pt. on home beta blockers     Neuro/Psych  Headaches,    GI/Hepatic   Endo/Other    Renal/GU      Musculoskeletal   Abdominal   Peds  Hematology   Anesthesia Other Findings   Reproductive/Obstetrics                          Anesthesia Physical Anesthesia Plan  ASA: II  Anesthesia Plan: General   Post-op Pain Management:    Induction: Intravenous  Airway Management Planned: Oral ETT  Additional Equipment:   Intra-op Plan:   Post-operative Plan: Extubation in OR  Informed Consent: I have reviewed the patients History and Physical, chart, labs and discussed the procedure including the risks, benefits and alternatives for the proposed anesthesia with the patient or authorized representative who has indicated his/her understanding and acceptance.     Plan Discussed with: CRNA and Surgeon  Anesthesia Plan Comments:         Anesthesia Quick Evaluation

## 2011-08-11 NOTE — Interval H&P Note (Signed)
History and Physical Interval Note:  08/11/2011 11:05 AM  Connie Gonzales  has presented today for surgery, with the diagnosis of right colon mass  The various methods of treatment have been discussed with the patient and family. After consideration of risks, benefits and other options for treatment, the patient has consented to  Procedure(s) (LRB): LAPAROSCOPIC RIGHT COLECTOMY as a surgical intervention .  The patients' history has been reviewed, patient examined, no change in status, stable for surgery.  I have reviewed the patients' chart and labs.  Questions were answered to the patient's satisfaction.     [name]

## 2011-08-11 NOTE — Op Note (Signed)
Preoperative diagnosis: Right colon mass Postoperative diagnosis: Same as above Procedure: Laparoscopic right colectomy Surgeon: Dr. Harden Mo Asst.: Dr. Marca Ancona Anesthesia: Gen. Endotracheal Specimens: Right colon to pathology Estimated blood loss: Minimal Complications: None Sponge count correct x2 at end of operation Disposition of patient to recovery room in stable condition  Indications: This is a 51 year old healthy female who presented for a screening colonoscopy and was found to have an everted appendix. The remainder of her colonoscopy showed some small polyps. She then underwent a CT scan which showed a cecal mass and an appendix that was not visualized. She was then referred for evaluation for what appeared to be on CT scan over 5 cm mass. She and I discussed her options and I recommended surgery for this area given the fact that we did not know what it was. We discussed a laparoscopic right colectomy with the risks and benefits associated with that.  Procedure: The patient first underwent a bowel preparation at home. After informed consent was obtained at the hospital she then had 1 g of intravenous Invanz administered. Sequential compression devices were placed on her lower extremities prior to induction of anesthesia.She was then placed under general endotracheal anesthesia without complication. Her abdomen was then prepped and draped in the standard sterile surgical fashion. A surgical timeout was then performed.  An orogastric tube and Foley catheter had been placed. Her stomach contents were evacuated. I then infiltrated quarter percent Marcaine in her left upper quadrant. I made an incision and then inserted a 5 mm trocar using the Optiview technique. There was no evidence of an entry injury. The abdomen was then insufflated to 15 mmHg pressure. I then inserted 3 further 5 mm trocars under direct vision after infiltration with local anesthetic without complication in the  suprapubic region right lower quadrant and left midabdomen.I then identified the cecum. There was no identifiable mass. There appeared to be some scarring in that region but there was no other abnormality that was obvious visually. I then used a Harmonic scalpel to take down the white line of Toldt in its entirety around the hepatic flexure. This mobilized the colon fairly easily and I was able to bring this to the midline. I couldn't really identify where the mass was somewhat wanted to palpate her colon. I then made a 6 cm incision above her umbilicus and inserted a wound protector. I exteriorized her entire right colon at that time. There was a small several centimeter mass in her cecum but no other abnormality was noted. I elected to perform a right colectomy at this time. I then divided her ileum well clear of where the mass and the cecum were. She had no visible appendix at all. I then divided her transverse colon just past the right branch of the middle colic artery. I then divided the mesentery using silk ligatures. This was passed off the table as a specimen. The pathologist confirmed that there indeed was a mass at the region of the appendiceal orifice. All the margins were clear and there were no other gross abnormalities. We wait we get the final pathology. I then approximated the ileum to the transverse colon with 3-0 silk sutures. I then made enterotomies in both inserted a GIA stapler. This was fired along the tinea. This created a nice patent anastomosis. The anastomosis was hemostatic. I then close the common enterotomy with a TA stapler. I then oversewed this with 3-0 silk. I then closed the mesenteric defect with 3-0 silk in  its entirety. I then placed 2 3-0 silk crotch stitches. The omentum was laid over the anastomosis. This all appeared to be in good position. I then placed this back in the abdomen and removed the wound protector. I then closed this incision with #1 PDS. I then looked back in  with the laparoscope and the anastomosis appeared in good position without any tension. I evacuated all of the fluid and irrigated. I then removed all the trocars and desufflated the abdomen. I irrigated the midline wound. I closed all the incisions with staples. Sterile dressings were placed. She tolerated this well was extubated in the operating room and transferred to recovery room in stable condition.

## 2011-08-11 NOTE — Anesthesia Postprocedure Evaluation (Signed)
  Anesthesia Post-op Note  Patient: Connie Gonzales  Procedure(s) Performed: Procedure(s) (LRB): LAPAROSCOPIC RIGHT COLECTOMY (Right)  Patient Location: PACU  Anesthesia Type: General  Level of Consciousness: awake  Airway and Oxygen Therapy: Patient Spontanous Breathing  Post-op Pain: mild  Post-op Assessment: Post-op Vital signs reviewed  Post-op Vital Signs: stable  Complications: No apparent anesthesia complications

## 2011-08-11 NOTE — Transfer of Care (Signed)
Immediate Anesthesia Transfer of Care Note  Patient: Connie Gonzales  Procedure(s) Performed: Procedure(s) (LRB): LAPAROSCOPIC RIGHT COLECTOMY (Right)  Patient Location: PACU  Anesthesia Type: General  Level of Consciousness: sedated  Airway & Oxygen Therapy: Patient Spontanous Breathing and Patient connected to nasal cannula oxygen  Post-op Assessment: Report given to PACU RN and Post -op Vital signs reviewed and stable  Post vital signs: stable  Complications: No apparent anesthesia complications

## 2011-08-11 NOTE — H&P (View-Only) (Signed)
Patient ID: Connie Gonzales, female   DOB: 03-12-1961, 51 y.o.   MRN: [id number]  Chief Complaint  Patient presents with  . Other    Eval colon mass    HPI Connie Gonzales is a 51 y.o. female.  Referred by Dr. 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 She underwent her first screening colonoscopy with absolutely no symptoms referable to her bowels recently. On her colonoscopy she was noted to have a 6 mm polyp in the descending colon and a 4 mm polyp in the sigmoid colon that were resected and are both tubular adenomas. There was an everted appendix that was noted as well and this underwent biopsy. showing benign colonic mucosa with melanosis coli. Following that she was then sent for a CT scan. Her CT scan shows a mass involving the cecum surrounding but not involving the fatty ileocecal valve and extending to the cecal tip measuring 3.9 x 3.7 x 6.7 cm. There is no evidence of a bowel obstruction. The appendix is not apparent. There were some mildly enlarged lymph nodes in the pericecal region as well. There appears to be a cyst in the liver as well. She comes in today to discuss her options. She is actually no symptoms referable to her bowels.  Past Medical History  Diagnosis Date  . Allergy   . Hypertension   . Hyperlipidemia   migraines   Past Surgical History  Procedure Date  . Wisdom teeth removal 1979  . Cyst removal from leg 1998    History reviewed. No pertinent family history.  Social History History  Substance Use Topics  . Smoking status: Never Smoker   . Smokeless tobacco: Not on file  . Alcohol Use: Yes    Allergies  Allergen Reactions  . Augmentin Other (See Comments)    upstet stomach    Current Outpatient Prescriptions  Medication Sig Dispense Refill  . atenolol (TENORMIN) 25 MG tablet Take 25 mg by mouth daily.      . simvastatin (ZOCOR) 40 MG tablet Take 40 mg by mouth every evening.        Occasional Relpax for migraines  Review of Systems Review of Systems  Constitutional: Negative for fever, chills and unexpected weight change.  HENT: Negative for hearing loss, congestion, sore throat, trouble swallowing and voice change.   Eyes: Negative for visual disturbance.  Respiratory: Negative for cough and wheezing.   Cardiovascular: Negative for chest pain, palpitations and leg swelling.  Gastrointestinal: Negative for nausea, vomiting, abdominal pain, diarrhea, constipation, blood in stool, abdominal distention and anal bleeding.  Genitourinary: Negative for hematuria, vaginal bleeding and difficulty urinating.  Musculoskeletal: Negative for arthralgias.  Skin: Negative for rash and wound.  Neurological: Negative for seizures, syncope and headaches.  Hematological: Negative for adenopathy. Does not bruise/bleed easily.  Psychiatric/Behavioral: Negative for confusion.    Blood pressure 112/68, pulse 70, resp. rate 18, height 5\' 4"  (1.626 m), weight 180 lb (81.647 kg).  Physical Exam Physical Exam  Vitals reviewed. Constitutional: She appears well-developed and well-nourished.  Eyes: No scleral icterus.  Neck: Neck supple.  Cardiovascular: Normal rate, regular rhythm and normal heart sounds.   Pulmonary/Chest: Effort normal and breath sounds normal. She has no wheezes. She has no rales.  Abdominal: Soft. Bowel sounds are normal. She exhibits no mass. There is no hepatomegaly. There is no tenderness.  Lymphadenopathy:    She has no cervical adenopathy.  Data Reviewed Colonoscopy reviewed  CT ABDOMEN AND PELVIS WITH CONTRAST 08/04/2011:  Technique: Multidetector CT imaging of the abdomen and pelvis was  performed following the standard protocol during bolus  administration of intravenous contrast.  Contrast: 80 ml Omnipaque-300 IV. Oral contrast was also  administered.  Comparison: None.  Findings: Mass involving the cecum, surrounding but not involving  the  fatty ileocecal valve and extending to the cecal tip, measuring  approximately 3.9 x 3.7 x 6.7 cm (series 2, image 58 and coronal  image 48). No evidence of small bowel obstruction by the mass.  Remainder of the colon normal in appearance. A normal appendix is  not identified. Mildly enlarged lymph nodes in the pericecal  region adjacent to the mass. No significant lymphadenopathy  elsewhere.  Vague low attenuation focus in the liver near the dome (series 2,  image 12 and coronal image 63). Liver otherwise normal in  appearance. Normal appearing spleen, pancreas, adrenal glands, and  kidneys. Gallbladder unremarkable by CT. No biliary ductal  dilation. No visible aorto-iliofemoral atherosclerosis.  Stomach and small bowel normal in appearance. No ascites. IUD  within the normal appearing uterus. Normal-appearing ovaries by  CT. No free pelvic fluid. Urinary bladder decompressed and  unremarkable. Pelvic phleboliths. Bone window images demonstrate  degenerative changes involving the lower thoracic spine and the  facet joints of the lower lumbar spine. Visualized lung bases  clear apart from the expected dependent atelectasis posteriorly.  Heart size upper normal.  IMPRESSION:  1. Mass involving the cecum, surrounding the ileocecal valve  without valve involvement. No associated small bowel distention.  If a submucosal as per history on colonoscopy, this could represent  a large endometrioma or an appendiceal myxoma which has migrated  superiorly. A normal-appearing appendix was not identified.  2. Numerous mildly enlarged mesenteric lymph nodes adjacent to the  cecal mass. No significant lymphadenopathy elsewhere.  3. Probable cyst in the liver near the dome, given its conspicuity  for its small size.   Assessment    Right colon mass    Plan    I had a long discussion with her and her husband today. We reviewed her CT scan. There is a fairly large right colon mass. The biopsy is  really inconclusive. It does appear that this is not a mucosal-based lesion and may very well be stemming from her appendix. There is certainly a concern for malignancy in any adenopathy associated with it as well. I think that the only option is really to resect this area. I recommended to him a laparoscopic assisted right colectomy. We discussed the option of an ileocecectomy I think that that would not be the best choice given the fact that I am fairly concerned that this is a tumor. We discussed the pathophysiology of the colon. I showed him a diagram which part of the colon would be resected with this operation. We discussed the risks of the operation including bleeding, and a 20% chance of infection, intra-abdominal abscess requiring drainage, anastomotic leak requiring reoperation, DVT, pulmonary embolus, further cardiac and pulmonary complications. We discussed that if this is a cancer we refer her afterwards for any other treatment that might be indicated. I spent about an hour discussing this with him as well as the different options and the risks associated with the procedure. She has a cruise that she's going on with her family in about 4 weeks so the choice would be to be due to his very soon with a small chance  of missing her cruise or weight until afterwards. She very much would like to do this beforehand so I will try to schedule her soon. I will plan on giving her entereg, Invanz, subcutaneous heparin, and a bowel prep preoperatively.       Derrika Ruffalo 08/06/2011, 11:30 AM

## 2011-08-11 NOTE — Anesthesia Procedure Notes (Signed)
Procedure Name: Intubation Date/Time: 08/11/2011 11:38 AM Performed by: Romie Minus Pre-anesthesia Checklist: Patient identified, Emergency Drugs available, Suction available and Patient being monitored Patient Re-evaluated:Patient Re-evaluated prior to inductionOxygen Delivery Method: Circle System Utilized Preoxygenation: Pre-oxygenation with 100% oxygen Intubation Type: IV induction Ventilation: Mask ventilation without difficulty and Oral airway inserted - appropriate to patient size Laryngoscope Size: Miller and 2 Grade View: Grade I Tube type: Oral Tube size: 7.5 mm Number of attempts: 1 Placement Confirmation: ETT inserted through vocal cords under direct vision,  positive ETCO2 and breath sounds checked- equal and bilateral Secured at: 21 cm Tube secured with: Tape Dental Injury: Teeth and Oropharynx as per pre-operative assessment

## 2011-08-11 NOTE — Discharge Instructions (Signed)
[facility]      [facility], [place] [phone]  POST OP INSTRUCTIONS  Always review your discharge instruction sheet given to you by the facility where your surgery was performed.  IF YOU HAVE DISABILITY OR FAMILY LEAVE FORMS, YOU MUST BRING THEM TO THE OFFICE FOR PROCESSING.  PLEASE DO NOT GIVE THEM TO YOUR DOCTOR.  1. A prescription for pain medication may be given to you upon discharge.  Take your pain medication as prescribed, if needed.  If narcotic pain medicine is not needed, then you may take acetaminophen (Tylenol) or ibuprofen (Advil) as needed. 2. Take your usually prescribed medications unless otherwise directed. 3. If you need a refill on your pain medication, please contact your pharmacy. They will contact our office to request authorization.  Prescriptions will not be filled after 5pm or on week-ends. 4. You should follow a light diet the first few days after arrival home, such as soup and crackers, pudding, etc.unless your doctor has advised otherwise. A high-fiber, low fat diet can be resumed as tolerated.   Be sure to include lots of fluids             daily.  5. Most patients will experience some swelling and bruising in the area of the incision. Ice pack will help. Swelling and bruising can take several days to resolve..  6. It is common to experience some constipation if taking pain medication after surgery.  Increasing fluid intake and taking a stool softener will usually help or prevent this problem from occurring.  A mild laxative (Milk of Magnesia or Miralax) should be taken according to package directions if there are no bowel movements after 48 hours. 7.  You may have steri-strips (small skin tapes) in place directly over the incision.  These strips should be left on the skin for 7-10 days.  If your surgeon used skin glue on the incision, you may shower in 24 hours.  The glue will flake off over the next 2-3 weeks.  Any sutures or staples will be removed at the office  during your follow-up visit. You may find that a light gauze bandage over your incision may keep your staples from being rubbed or pulled. You may shower and replace the bandage daily. 8. ACTIVITIES:  You may resume regular (light) daily activities beginning the next day--such as daily self-care, walking, climbing stairs--gradually increasing activities as tolerated.  You may have sexual intercourse when it is comfortable.  Refrain from any heavy lifting or straining until approved by your doctor. a. You may drive when you no longer are taking prescription pain medication, you can comfortably wear a seatbelt, and you can safely maneuver your car and apply brakes b. Return to Work: ___________________________________ 9. You should see your doctor in the office for a follow-up appointment approximately two weeks after your surgery.  Make sure that you call for this appointment within a day or two after you arrive home to insure a convenient appointment time. OTHER INSTRUCTIONS:  _____________________________________________________________ _____________________________________________________________  WHEN TO CALL YOUR DOCTOR: 1. Fever over 101.0 2. Inability to urinate 3. Nausea and/or vomiting 4. Extreme swelling or bruising 5. Continued bleeding from incision. 6. Increased pain, redness, or drainage from the incision. 7. Difficulty swallowing or breathing 8. Muscle cramping or spasms. 9. Numbness or tingling in hands or feet or around lips.  The clinic staff is available to answer your questions during regular business hours.  Please don't hesitate to call and ask to speak to one  of the nurses if you have concerns.  For further questions, please visit [email or web address]

## 2011-08-12 LAB — BASIC METABOLIC PANEL
BUN: 16 mg/dL (ref 6–23)
CO2: 24 mEq/L (ref 19–32)
Chloride: 106 mEq/L (ref 96–112)
Creatinine, Ser: 0.59 mg/dL (ref 0.50–1.10)
Glucose, Bld: 129 mg/dL — ABNORMAL HIGH (ref 70–99)
Potassium: 3.8 mEq/L (ref 3.5–5.1)

## 2011-08-12 LAB — CBC
HCT: 31.8 % — ABNORMAL LOW (ref 36.0–46.0)
Hemoglobin: 10.9 g/dL — ABNORMAL LOW (ref 12.0–15.0)
MCH: 31.3 pg (ref 26.0–34.0)
MCHC: 34.3 g/dL (ref 30.0–36.0)
MCV: 91.4 fL (ref 78.0–100.0)
RDW: 12.3 % (ref 11.5–15.5)

## 2011-08-12 MED ORDER — MORPHINE SULFATE (PF) 1 MG/ML IV SOLN
INTRAVENOUS | Status: AC
  Administered 2011-08-12: 16:00:00
  Filled 2011-08-12: qty 25

## 2011-08-12 NOTE — Progress Notes (Signed)
UR of chart complete. No anticipated HH needs. Pt has parents and spouse to assist at d/c.

## 2011-08-12 NOTE — Progress Notes (Addendum)
1 Day Post-Op  Subjective: No flatus, minimal nausea, pain controlled  Objective: Vital signs in last 24 hours: Temp:  [97.4 F (36.3 C)-98.9 F (37.2 C)] 98.9 F (37.2 C) (02/13 0600) Pulse Rate:  [66-85] 85  (02/13 0600) Resp:  [15-20] 16  (02/13 0745) BP: (101-126)/(53-74) 118/57 mmHg (02/13 0600) SpO2:  [95 %-100 %] 97 % (02/13 0745) Weight:  [180 lb (81.647 kg)] 180 lb (81.647 kg) (02/12 1700) Last BM Date: 08/10/11  Intake/Output from previous day: 02/12 0701 - 02/13 0700 In: 2000 [I.V.:2000] Out: 630 [Urine:630] Intake/Output this shift:    General appearance: no distress Resp: diminished breath sounds bibasilar Cardio: RR GI: dressings dry, few bs, approp tender  Lab Results:   Basename 08/12/11 0640  WBC 12.1*  HGB 10.9*  HCT 31.8*  PLT 184    Assessment/Plan: POD #1 s/p lap right colectomy for mass. 1. Continue PCA 2. Pulm toilet, OOB today, ICS 3. Cont home meds for BP 4. Await ileus resolve, cont entereg, will give some clears today 5. DC foley 6. Follow hct I think dilutional 7. SCDS, heparin 8. Await pathology   LOS: 1 day    [facility] 08/12/2011

## 2011-08-13 MED ORDER — KETOROLAC TROMETHAMINE 15 MG/ML IJ SOLN
15.0000 mg | Freq: Three times a day (TID) | INTRAMUSCULAR | Status: DC | PRN
  Administered 2011-08-13 – 2011-08-14 (×3): 15 mg via INTRAVENOUS
  Filled 2011-08-13 (×4): qty 1

## 2011-08-13 NOTE — Progress Notes (Signed)
2 Days Post-Op  Subjective: Headache yesterday now doing better, no flatus yet, ambulating  Objective: Vital signs in last 24 hours: Temp:  [98.4 F (36.9 C)-99.4 F (37.4 C)] 99 F (37.2 C) (02/14 0515) Pulse Rate:  [67-85] 83  (02/14 0515) Resp:  [16-19] 18  (02/14 0515) BP: (100-126)/(47-65) 126/65 mmHg (02/14 0515) SpO2:  [90 %-99 %] 94 % (02/14 0515) Last BM Date: 08/10/11  Intake/Output from previous day: 02/13 0701 - 02/14 0700 In: 2349 [I.V.:2349] Out: 1950 [Urine:1950] Intake/Output this shift: Total I/O In: -  Out: 500 [Urine:500]  General appearance: no distress Resp: clear to auscultation bilaterally Cardio: RRR GI: some bs present, wounds clean, approp tender  Assessment/Plan: POD 2 lap right colectomy 1. Will dc pca and give prn narcotics and add toradol today, pca giving her headache and she stopped using 2. Pulm toilet 3. Discussed pathology today- benign endometrioma 4. Await ileus to resolve, continue entereg, clear liquids only 5. Heparin, scds 6. Check labs tomorrow  LOS: 2 days    [facility] 08/13/2011

## 2011-08-13 NOTE — Progress Notes (Signed)
Pt followed for progression of care and post d/c needs as a benefit of [employer]. Pt will receive a post d/c transition of care call for assessment of needs. Pt given [facility] information and contact numbers to call if needs arise prior to transition of care call. Will follow up as needed and appropriate.  Brooke Bonito C. Amirr Achord, RN, CCM, [facility], [facility] Care Management program, # [phone].

## 2011-08-13 NOTE — Progress Notes (Signed)
Patient had small drop of dark bloody drainage per rectum while passing gas this am.  At this time passed dark bloody drainage per rectum (1200 hr) aprox. 30 ml.  Dr. Dwain Sarna on unit and informed of this, new order noted.

## 2011-08-14 LAB — BASIC METABOLIC PANEL
CO2: 24 mEq/L (ref 19–32)
Calcium: 8.6 mg/dL (ref 8.4–10.5)
Creatinine, Ser: 0.55 mg/dL (ref 0.50–1.10)
GFR calc non Af Amer: >90 mL/min (ref >90)
Sodium: 141 mEq/L (ref 135–145)

## 2011-08-14 LAB — CBC
MCH: 30.7 pg (ref 26.0–34.0)
MCHC: 33.1 g/dL (ref 30.0–36.0)
MCV: 92.6 fL (ref 78.0–100.0)
Platelets: 175 10*3/uL (ref 150–400)
RBC: 3.23 MIL/uL — ABNORMAL LOW (ref 3.87–5.11)

## 2011-08-14 MED ORDER — OXYCODONE-ACETAMINOPHEN 5-325 MG PO TABS
1.0000 | ORAL_TABLET | ORAL | Status: DC | PRN
  Administered 2011-08-14: 2 via ORAL
  Filled 2011-08-14: qty 2

## 2011-08-14 MED ORDER — ELETRIPTAN HYDROBROMIDE 20 MG PO TABS
30.0000 mg | ORAL_TABLET | ORAL | Status: DC | PRN
  Filled 2011-08-14: qty 2

## 2011-08-14 NOTE — Progress Notes (Signed)
3 Days Post-Op  Subjective: Passing flatus, small bm, no n/v, pain controlled  Objective: Vital signs in last 24 hours: Temp:  [98.3 F (36.8 C)-98.8 F (37.1 C)] 98.6 F (37 C) (02/15 0541) Pulse Rate:  [68-86] 68  (02/15 0541) Resp:  [18] 18  (02/15 0541) BP: (110-117)/(54-68) 117/68 mmHg (02/15 0541) SpO2:  [94 %-96 %] 96 % (02/15 0541) Last BM Date: 08/13/11  Intake/Output from previous day: 02/14 0701 - 02/15 0700 In: 1749 [P.O.:240; I.V.:1509] Out: 2500 [Urine:2500] Intake/Output this shift:    General appearance: no distress Resp: clear to auscultation bilaterally Cardio: RRR GI: incisions clean without infection, appropr tender, bs present  Lab Results:   Basename 08/12/11 0640  WBC 12.1*  HGB 10.9*  HCT 31.8*  PLT 184   BMET  Basename 08/12/11 0640  NA 138  K 3.8  CL 106  CO2 24  GLUCOSE 129*  BUN 16  CREATININE 0.59  CALCIUM 8.6   PT/INR No results found for this basename: LABPROT:2,INR:2 in the last 72 hours ABG No results found for this basename: PHART:2,PCO2:2,PO2:2,HCO3:2 in the last 72 hours  Studies/Results: No results found.  Anti-infectives: Anti-infectives     Start     Dose/Rate Route Frequency Ordered Stop   08/10/11 1445   ertapenem (INVANZ) 1 g in sodium chloride 0.9 % 50 mL IVPB        1 g 100 mL/hr over 30 Minutes Intravenous 60 min pre-op 08/10/11 1431 08/11/11 1124          Assessment/Plan: POD #3 lap right colon PO pain meds Saline lock IV Check labs this am Possibly home tomorrow   LOS: 3 days    [facility] 08/14/2011

## 2011-08-15 MED ORDER — HYDROCODONE-ACETAMINOPHEN 5-325 MG PO TABS: 1.0000 | ORAL_TABLET | Freq: Four times a day (QID) | ORAL | Status: DC | PRN

## 2011-08-15 NOTE — Progress Notes (Signed)
Patient ID: Connie Gonzales, female   DOB: April 03, 1961, 51 y.o.   MRN: [id number] 4 Days Post-Op  Subjective: Continues to do well.  No nausea/vomiting.    Objective: Vital signs in last 24 hours: Temp:  [97.5 F (36.4 C)-98.1 F (36.7 C)] 98.1 F (36.7 C) (02/16 0524) Pulse Rate:  [66-68] 68  (02/16 0524) Resp:  [16-18] 18  (02/16 0524) BP: (105-129)/(63-73) 129/73 mmHg (02/16 0524) SpO2:  [96 %-98 %] 96 % (02/16 0524) Last BM Date: 08/14/11  Intake/Output from previous day: 02/15 0701 - 02/16 0700 In: -  Out: 1300 [Urine:1300] Intake/Output this shift: Total I/O In: -  Out: 550 [Urine:550]  General appearance: no distress Resp: clear to auscultation bilaterally Cardio: RRR GI: incisions clean without infection, appropr tender, bs present  Lab Results:   Basename 08/14/11 0835  WBC 5.4  HGB 9.9*  HCT 29.9*  PLT 175   BMET  Basename 08/14/11 0835  NA 141  K 3.5  CL 109  CO2 24  GLUCOSE 103*  BUN 10  CREATININE 0.55  CALCIUM 8.6   PT/INR No results found for this basename: LABPROT:2,INR:2 in the last 72 hours ABG No results found for this basename: PHART:2,PCO2:2,PO2:2,HCO3:2 in the last 72 hours  Studies/Results: No results found.  Anti-infectives: Anti-infectives     Start     Dose/Rate Route Frequency Ordered Stop   08/10/11 1445   ertapenem (INVANZ) 1 g in sodium chloride 0.9 % 50 mL IVPB        1 g 100 mL/hr over 30 Minutes Intravenous 60 min pre-op 08/10/11 1431 08/11/11 1124          Assessment/Plan: POD #4 lap right colon PO pain meds, no oxycodone, gives headache. Advance diet. Home today   LOS: 4 days    [facility] 08/15/2011

## 2011-08-15 NOTE — Progress Notes (Signed)
Pt. Discharged 08/15/2011  10:00 AM Discharge instructions reviewed with patient/family. Patient/family verbalized understanding. All Rx's given. Questions answered as needed. Pt. Discharged to home with family/self. Taken off unit via [facility]

## 2011-08-17 ENCOUNTER — Telehealth (INDEPENDENT_AMBULATORY_CARE_PROVIDER_SITE_OTHER): Payer: Self-pay

## 2011-08-17 NOTE — Telephone Encounter (Signed)
Called pt to make her nurse only and po appt for pt. The pt is doing good after surgery and she will see me this Wednesday to have staples removed with steristrips applied.

## 2011-08-18 NOTE — Discharge Summary (Signed)
Physician Discharge Summary  Patient ID: Connie Gonzales MRN: [id number] DOB/AGE: 52-11-1960 52 y.o.  Admit date: 08/11/2011 Discharge date: 08/18/2011  Admission Diagnoses: Right colon mass  Discharge Diagnoses:  Active Problems:  * No active hospital problems. *    Discharged Condition: good  Hospital Course: 36 yof with right colon submucosal mass on colonoscopy with everted appendix and CT showing mass.  We went to OR for lap right colectomy which she tolerated well.  This was an endometrioma.  Postoperatively she did well.  She had return of bowel function, tolerating diet and pain controlled.  Consults: None   Treatments: surgery: lap right colectomy   Disposition: 01-Home or Self Care  Discharge Orders    Future Appointments: Provider: Department: Dept Phone: Center:   08/19/2011 11:00 AM [facility] [phone] None   08/24/2011 1:50 PM Emelia Loron, MD [facility] [phone] None     Future Orders Please Complete By Expires   Diet - low sodium heart healthy      Increase activity slowly      Call MD for:  temperature >100.4      Call MD for:  persistant nausea and vomiting      Call MD for:  severe uncontrolled pain      Call MD for:  redness, tenderness, or signs of infection (pain, swelling, redness, odor or green/yellow discharge around incision site)      Call MD for:  persistant dizziness or light-headedness        Medication List  As of 08/18/2011  9:52 AM   TAKE these medications         atenolol 25 MG tablet   Commonly known as: TENORMIN   Take 25 mg by mouth daily.      eletriptan 40 MG tablet   Commonly known as: RELPAX   One tablet by mouth as needed for migraine headache.  If the headache improves and then returns, dose may be repeated after 2 hours have elapsed since first dose (do not exceed 80 mg per day). may repeat in 2 hours if necessary      HYDROcodone-acetaminophen 5-325 MG per tablet   Commonly known as:  NORCO   Take 1-2 tablets by mouth every 6 (six) hours as needed for pain.      simvastatin 40 MG tablet   Commonly known as: ZOCOR   Take 40 mg by mouth every evening.           Follow-up Information    Follow up with [facility], MD. Schedule an appointment as soon as possible for a visit in 1 week. (We will call to setup staple removal this week)    Contact information:   [facility] [place] [address] [place] [address] [phone]          Signed: Emelia Loron 08/18/2011, 9:52 AM

## 2011-08-19 ENCOUNTER — Ambulatory Visit (INDEPENDENT_AMBULATORY_CARE_PROVIDER_SITE_OTHER): Payer: Commercial Managed Care - PPO

## 2011-08-19 DIAGNOSIS — Z9889 Other specified postprocedural states: Secondary | ICD-10-CM

## 2011-08-19 DIAGNOSIS — Z9049 Acquired absence of other specified parts of digestive tract: Secondary | ICD-10-CM

## 2011-08-19 NOTE — Progress Notes (Signed)
Pt came in for nurse only to have sutures removed at colon incision and steri strips applied. The pt tolerated the staple removal and steristrips applied. The pt will f/u with Dr Dwain Sarna on 08-24-11. Pt advised to call our office if any changes.

## 2011-08-24 ENCOUNTER — Encounter (INDEPENDENT_AMBULATORY_CARE_PROVIDER_SITE_OTHER): Payer: Self-pay | Admitting: General Surgery

## 2011-08-24 ENCOUNTER — Ambulatory Visit (INDEPENDENT_AMBULATORY_CARE_PROVIDER_SITE_OTHER): Payer: Commercial Managed Care - PPO | Admitting: General Surgery

## 2011-08-24 VITALS — BP 112/74 | HR 68 | Temp 97.6°F | Resp 12 | Ht 64.0 in | Wt 177.8 lb

## 2011-08-24 DIAGNOSIS — Z09 Encounter for follow-up examination after completed treatment for conditions other than malignant neoplasm: Secondary | ICD-10-CM

## 2011-08-24 NOTE — Progress Notes (Signed)
Subjective:     Patient ID: Connie Gonzales, female   DOB: October 06, 1960, 51 y.o.   MRN: [id number]  HPI This is a 51 year old female who I saw with a submucosal right colon mass. This looked like it might be a large mass on CT scan. We then took her to the operating room for a laparoscopic right colectomy. Her pathology shows submucosal endometriosis and no evidence of malignancy. She did well in the hospital except for a migraine which is now completely resolved. She reports her energy is not normal but her appetite is improving. She is having bowel movements and is tolerating her diet. She is back to most of her normal activities it has not required any pain medication since going home. She  just returned from the beach.  Review of Systems     Objective:   Physical Exam Well healing incisions without infection    Assessment:     S/p lap right colectomy for submucosal endometriosis    Plan:         She is doing very well. I released her to full activity at a month. She is cleared to go on a cruise. I will see her back as needed.

## 2011-09-10 ENCOUNTER — Telehealth (INDEPENDENT_AMBULATORY_CARE_PROVIDER_SITE_OTHER): Payer: Self-pay | Admitting: General Surgery

## 2011-09-10 NOTE — Telephone Encounter (Signed)
PT CALLED TO REQUEST RETURN TO WORK NOTE FOR 09-07-11/ NOTE COMPLETE AND FAXED TO BRENDA JONES  [phone] PER PT REQUEST/GY/TO BE SCANNED

## 2011-09-17 ENCOUNTER — Encounter (INDEPENDENT_AMBULATORY_CARE_PROVIDER_SITE_OTHER): Payer: Self-pay

## 2012-01-25 ENCOUNTER — Other Ambulatory Visit (HOSPITAL_COMMUNITY): Payer: Self-pay | Admitting: Family Medicine

## 2012-01-25 DIAGNOSIS — Z1231 Encounter for screening mammogram for malignant neoplasm of breast: Secondary | ICD-10-CM

## 2012-02-17 ENCOUNTER — Ambulatory Visit (HOSPITAL_COMMUNITY): Payer: 59

## 2012-03-21 ENCOUNTER — Ambulatory Visit (HOSPITAL_COMMUNITY)
Admission: RE | Admit: 2012-03-21 | Discharge: 2012-03-21 | Disposition: A | Discharge status: Home / Self Care | Payer: 59 | Source: Ambulatory Visit | Attending: Family Medicine | Admitting: Family Medicine

## 2012-03-21 DIAGNOSIS — Z1231 Encounter for screening mammogram for malignant neoplasm of breast: Secondary | ICD-10-CM | POA: Insufficient documentation

## 2012-07-13 ENCOUNTER — Other Ambulatory Visit (HOSPITAL_COMMUNITY): Payer: Self-pay | Admitting: Family Medicine

## 2012-07-13 DIAGNOSIS — Z780 Asymptomatic menopausal state: Secondary | ICD-10-CM

## 2012-07-13 DIAGNOSIS — Z1231 Encounter for screening mammogram for malignant neoplasm of breast: Secondary | ICD-10-CM

## 2012-11-11 ENCOUNTER — Other Ambulatory Visit: Payer: Self-pay | Admitting: Otolaryngology

## 2012-11-11 ENCOUNTER — Ambulatory Visit
Admission: RE | Admit: 2012-11-11 | Discharge: 2012-11-11 | Disposition: A | Discharge status: Home / Self Care | Payer: 59 | Source: Ambulatory Visit | Attending: Otolaryngology | Admitting: Otolaryngology

## 2012-11-11 DIAGNOSIS — J351 Hypertrophy of tonsils: Secondary | ICD-10-CM

## 2012-11-11 DIAGNOSIS — R1313 Dysphagia, pharyngeal phase: Secondary | ICD-10-CM

## 2012-11-11 DIAGNOSIS — J039 Acute tonsillitis, unspecified: Secondary | ICD-10-CM

## 2012-11-11 DIAGNOSIS — J029 Acute pharyngitis, unspecified: Secondary | ICD-10-CM

## 2012-11-11 DIAGNOSIS — J36 Peritonsillar abscess: Secondary | ICD-10-CM

## 2012-11-11 MED ORDER — IOHEXOL 300 MG/ML  SOLN
75.0000 mL | Freq: Once | INTRAMUSCULAR | Status: AC | PRN
  Administered 2012-11-11: 75 mL via INTRAVENOUS

## 2012-12-11 ENCOUNTER — Emergency Department (HOSPITAL_COMMUNITY)
Admission: EM | Admit: 2012-12-11 | Discharge: 2012-12-11 | Disposition: A | Discharge status: Home / Self Care | Payer: 59 | Source: Home / Self Care | Attending: Emergency Medicine | Admitting: Emergency Medicine

## 2012-12-11 ENCOUNTER — Encounter (HOSPITAL_COMMUNITY): Payer: Self-pay | Admitting: Emergency Medicine

## 2012-12-11 DIAGNOSIS — L255 Unspecified contact dermatitis due to plants, except food: Secondary | ICD-10-CM

## 2012-12-11 DIAGNOSIS — L237 Allergic contact dermatitis due to plants, except food: Secondary | ICD-10-CM

## 2012-12-11 DIAGNOSIS — L270 Generalized skin eruption due to drugs and medicaments taken internally: Secondary | ICD-10-CM

## 2012-12-11 MED ORDER — METHYLPREDNISOLONE ACETATE 80 MG/ML IJ SUSP
80.0000 mg | Freq: Once | INTRAMUSCULAR | Status: AC
  Administered 2012-12-11: 80 mg via INTRAMUSCULAR

## 2012-12-11 MED ORDER — TRIAMCINOLONE ACETONIDE 0.1 % EX CREA: TOPICAL_CREAM | Freq: Three times a day (TID) | CUTANEOUS | Status: AC

## 2012-12-11 MED ORDER — METHYLPREDNISOLONE ACETATE 80 MG/ML IJ SUSP
INTRAMUSCULAR | Status: AC
  Filled 2012-12-11: qty 1

## 2012-12-11 MED ORDER — HYDROXYZINE HCL 25 MG PO TABS: 25.0000 mg | ORAL_TABLET | Freq: Four times a day (QID) | ORAL | Status: AC

## 2012-12-11 MED ORDER — PREDNISONE 20 MG PO TABS: ORAL_TABLET | ORAL | Status: DC

## 2012-12-11 NOTE — ED Provider Notes (Signed)
Chief Complaint:   Chief Complaint  Patient presents with  . 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 This initially involved the arms, then spread to the trunk. She was exposed to poison ivy about 5 days before the rash in the arms appeared and she is also finishing up a course of clindamycin for a mild peritonsillar abscess. She denies any difficulty breathing, wheezing, coughing, or swelling of the lips, tongue, or throat.  Review of Systems:  Other than noted above, the patient denies any of the following symptoms: Systemic:  No fever, chills, sweats, weight loss, or fatigue. ENT:  No nasal congestion, rhinorrhea, sore throat, swelling of lips, tongue or throat. Resp:  No cough, wheezing, or shortness of breath. Skin:  No rash, itching, nodules, or suspicious lesions.  PMFSH:  Past medical history, family history, social history, meds, and allergies were reviewed.   Physical Exam:   Vital signs:  BP 113/65  Pulse 70  Temp(Src) 98.4 F (36.9 C) (Oral)  Resp 14  SpO2 98%  LMP 08/06/2009 Gen:  Alert, oriented, in no distress. ENT:  Pharynx clear, no intraoral lesions, moist mucous membranes. Lungs:  Clear to auscultation. Skin:  There appears to be 2 different kinds of rash. She has streaks and patches of maculopapules on the arms. These are widely scattered and not blistered area and she also has a couple of erythematous patches on her chin and neck. On her trunk she has scattered erythematous maculopapules.  Course in Urgent Care Center:   Given Depo-Medrol 80 mg IM.  Assessment:  The primary encounter diagnosis was Poison ivy. A diagnosis of Drug eruption was also pertinent to this visit.  I think she has 2 different kinds of rash. The rash in the arms appears to be poison ivy. I'm not sure about the rash in the  neck, but the rash in the trunk appears to be an allergic reaction to drug, probably clindamycin. She was warned not to take clindamycin again.  Plan:   1.  The following meds were prescribed:   Discharge Medication List as of 12/11/2012 11:32 AM    START taking these medications   Details  hydrOXYzine (ATARAX/VISTARIL) 25 MG tablet Take 1 tablet (25 mg total) by mouth every 6 (six) hours., Starting 12/11/2012, Until Discontinued, Normal    predniSONE (DELTASONE) 20 MG tablet 3 daily for 5 days, 2 daily for 5 days, 1 daily for 5 days, Normal    triamcinolone cream (KENALOG) 0.1 % Apply topically 3 (three) times daily., Starting 12/11/2012, Until Discontinued, Normal       2.  The patient was instructed in symptomatic care and handouts were given. 3.  The patient was told to return if becoming worse in any way, if no better in 3 or 4 days, and given some red flag symptoms such as difficulty breathing or wheezing that would indicate earlier return. 4.  Follow up here if necessary.     Reuben Likes, MD 12/11/12 [phone]

## 2012-12-11 NOTE — ED Notes (Signed)
Pt report a rash since 6/5. Pt states that it she is having a possible reaction to meds and has a ivy rash.  Denies any other symptoms.

## 2013-02-21 IMAGING — CT CT ABD-PELV W/ CM
2 of 5 series · 13 of 32 positions shown, 18 images · IV contrast (READICAT & 80ml omni 300)
Comparison: None.

CLINICAL DATA: Periappendiceal submucosal lesion identified at
colonoscopy.  Indwelling Dedrick IUD.

CT ABDOMEN AND PELVIS WITH CONTRAST 08/04/2011:
TECHNIQUE: Multidetector CT imaging of the abdomen and pelvis was
performed following the standard protocol during bolus
administration of intravenous contrast.
Contrast:  80 ml Bmnipaque-6QQ IV.  Oral contrast was also
administered.

[Series 2: routine abdomen · axial · 0.78mm/px · z∈[-416,-76]mm · 6 of 96 slices shown, 11 images]
[im 14/96  soft-tissue]
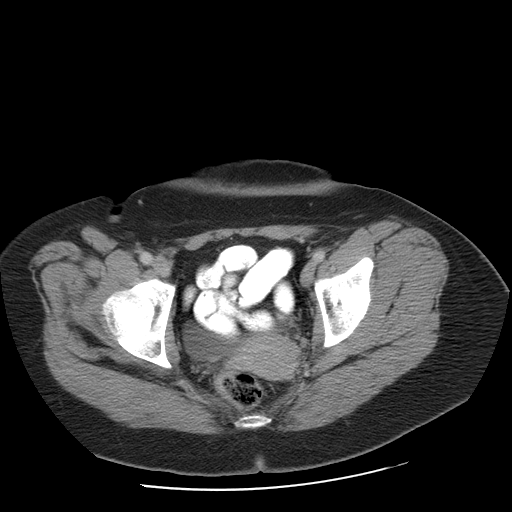
[im 14/96  bone]
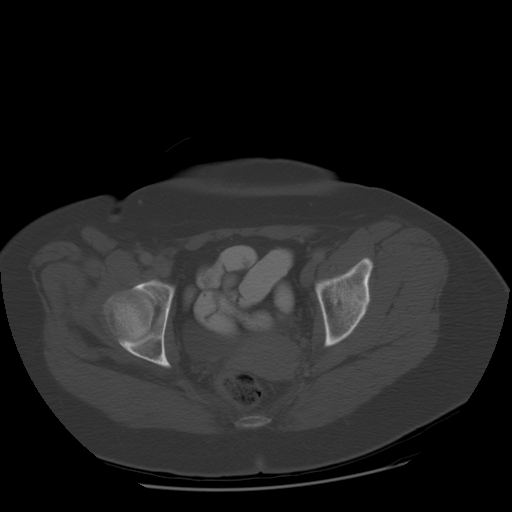
[im 28/96  soft-tissue]
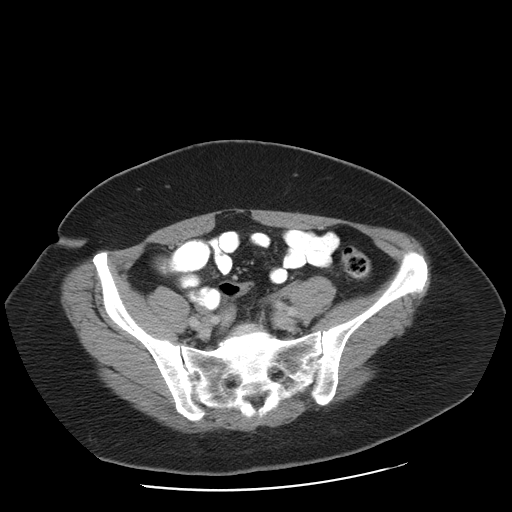
[im 41/96  soft-tissue]
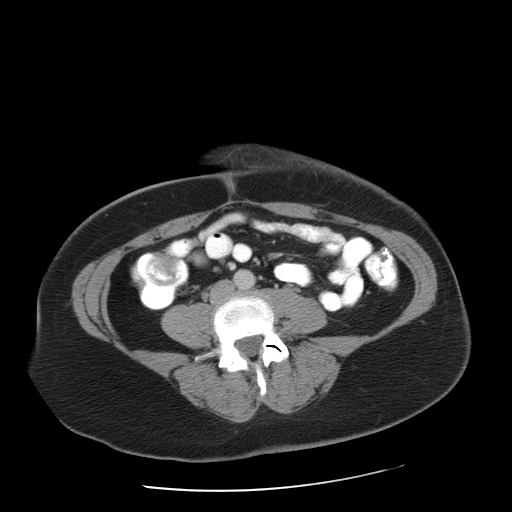
[im 41/96  lung]
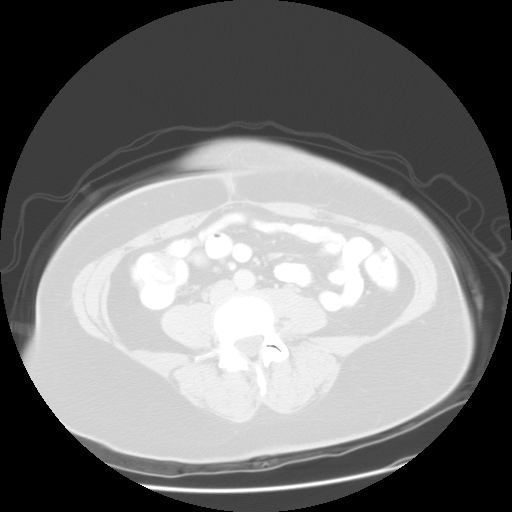
[im 55/96  soft-tissue]
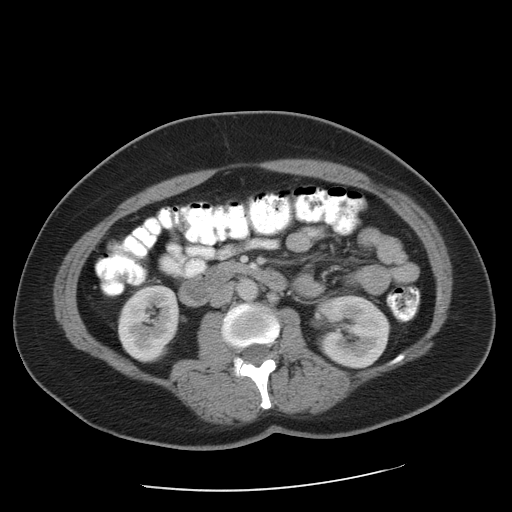
[im 55/96  lung]
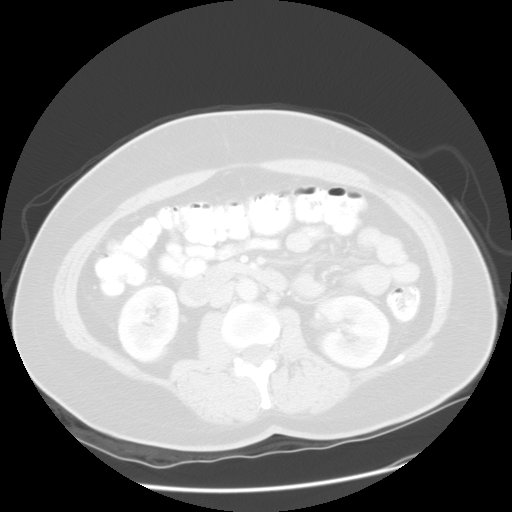
[im 68/96  soft-tissue]
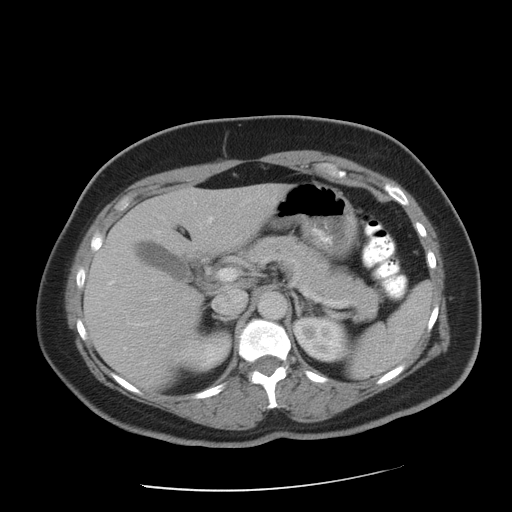
[im 68/96  lung]
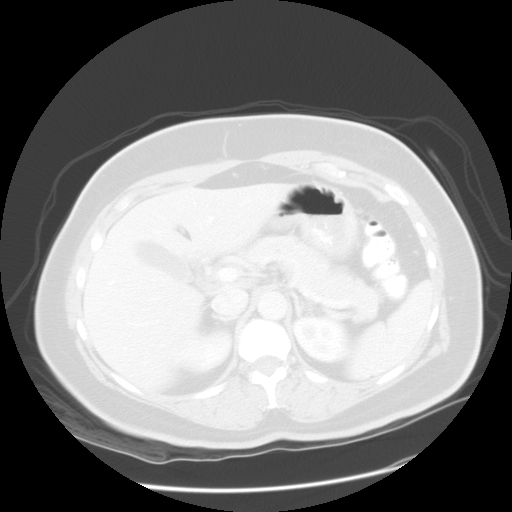
[im 82/96  soft-tissue]
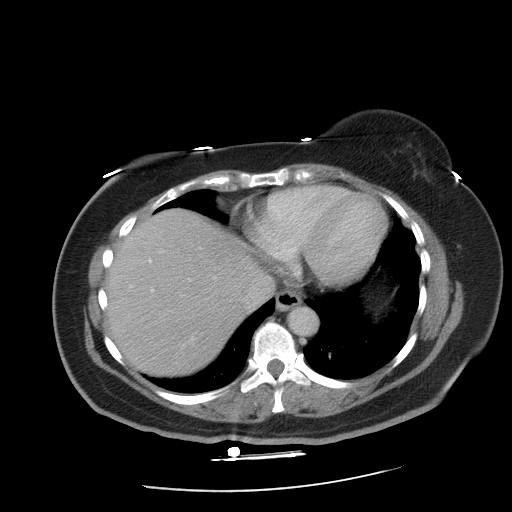
[im 82/96  lung]
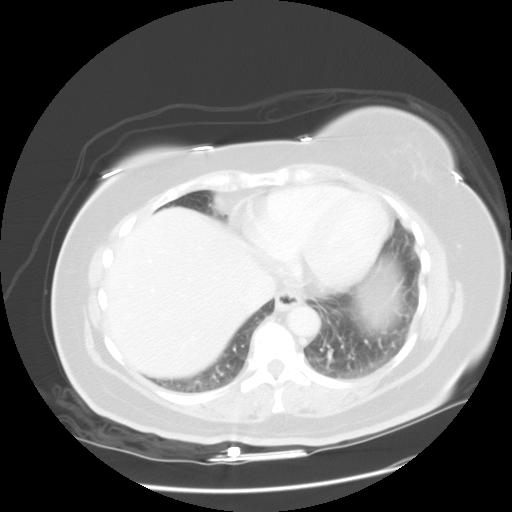

[Series 401: sagittal · sagittal · 0.98mm/px · 7 of 122 slices shown]
[im 13/122  soft-tissue]
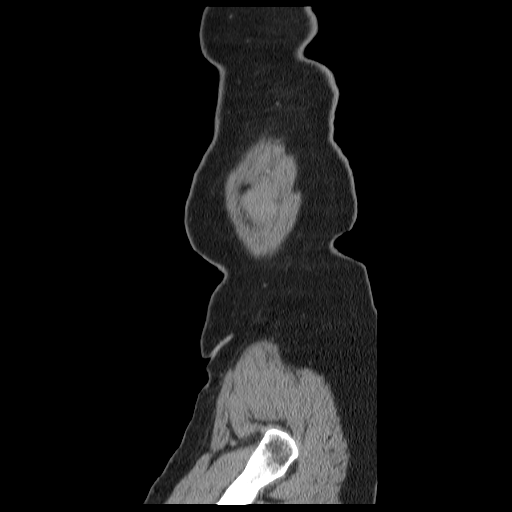
[im 25/122  soft-tissue]
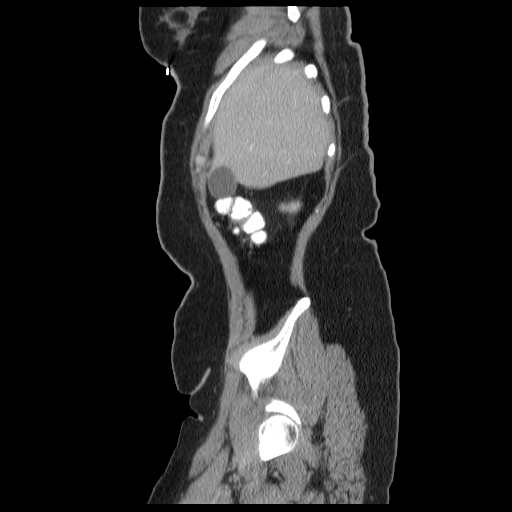
[im 37/122  soft-tissue]
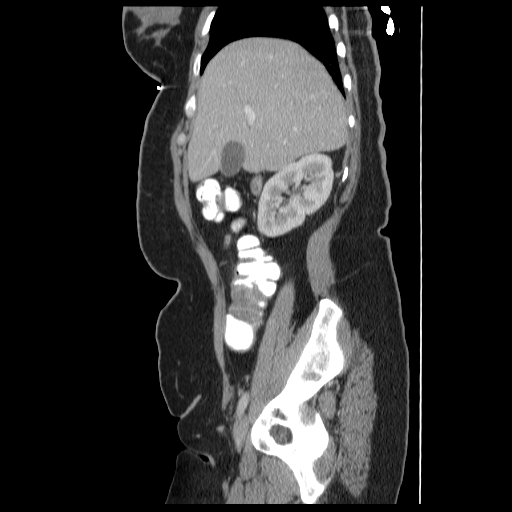
[im 49/122  soft-tissue]
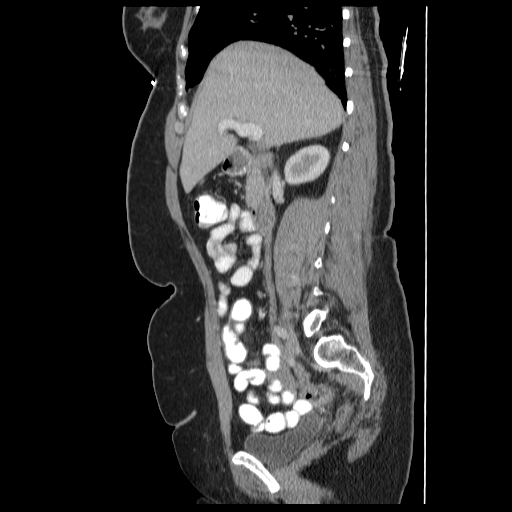
[im 73/122  soft-tissue]
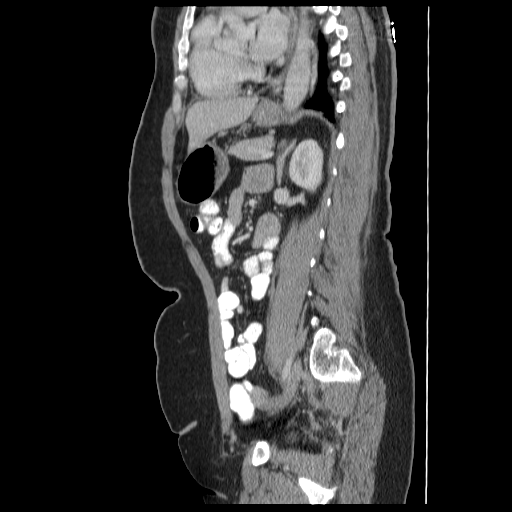
[im 85/122  soft-tissue]
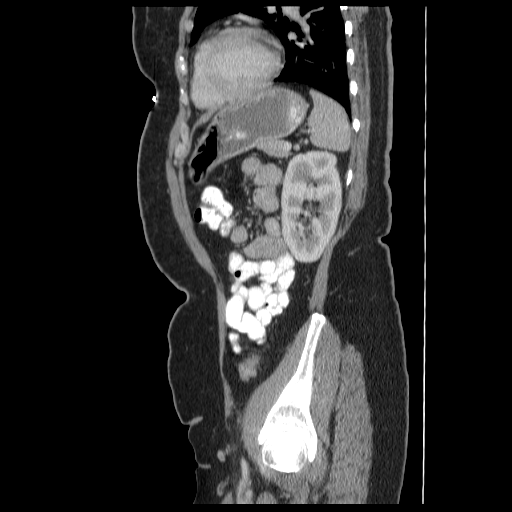
[im 97/122  soft-tissue]
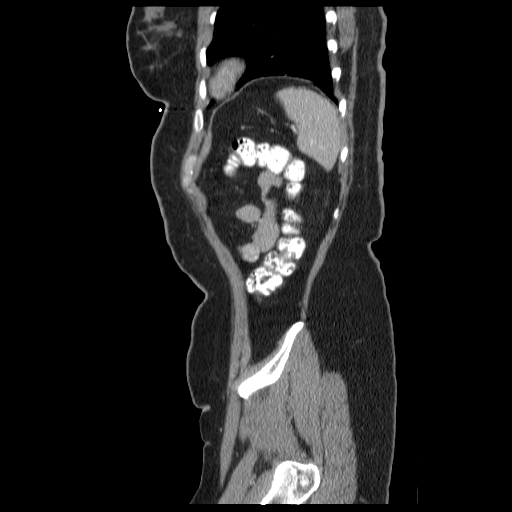

[13 of 32 positions shown; findings below may reference images not displayed]

FINDINGS: Mass involving the cecum, surrounding but not involving
the fatty ileocecal valve and extending to the cecal tip, measuring
approximately 3.9 x 3.7 x 6.7 cm (series 2, image 58 and coronal
image 48). No evidence of small bowel obstruction by the mass.
Remainder of the colon normal in appearance.  A normal appendix is
not identified.  Mildly enlarged lymph nodes in the pericecal
region adjacent to the mass.  No significant lymphadenopathy
elsewhere.

Vague low attenuation focus in the liver near the dome (series 2,
image 12 and coronal image 63).  Liver otherwise normal in
appearance.  Normal appearing spleen, pancreas, adrenal glands, and
kidneys.  Gallbladder unremarkable by CT.  No biliary ductal
dilation.  No visible aorto-iliofemoral atherosclerosis.

Stomach and small bowel normal in appearance.  No ascites.  IUD
within the normal appearing uterus.  Normal-appearing ovaries by
CT.  No free pelvic fluid.  Urinary bladder decompressed and
unremarkable.  Pelvic phleboliths.  Bone window images demonstrate
degenerative changes involving the lower thoracic spine and the
facet joints of the lower lumbar spine.  Visualized lung bases
clear apart from the expected dependent atelectasis posteriorly.
Heart size upper normal.
IMPRESSION: 1.  Mass involving the cecum, surrounding the ileocecal valve
without valve involvement.  No associated small bowel distention.
If a submucosal as per history on colonoscopy, this could represent
a large endometrioma or an appendiceal myxoma which has migrated
superiorly.  A normal-appearing appendix was not identified.
2.  Numerous mildly enlarged mesenteric lymph nodes adjacent to the
cecal mass.  No significant lymphadenopathy elsewhere.
3.  Probable cyst in the liver near the dome, given its conspicuity
for its small size.

## 2013-02-24 IMAGING — CR DG CHEST 2V
2 series · 2 of 2 positions shown · non-contrast
Comparison: None

CLINICAL DATA: Preop for colectomy.

CHEST - 2 VIEW

[view not recorded (1 of 2)]
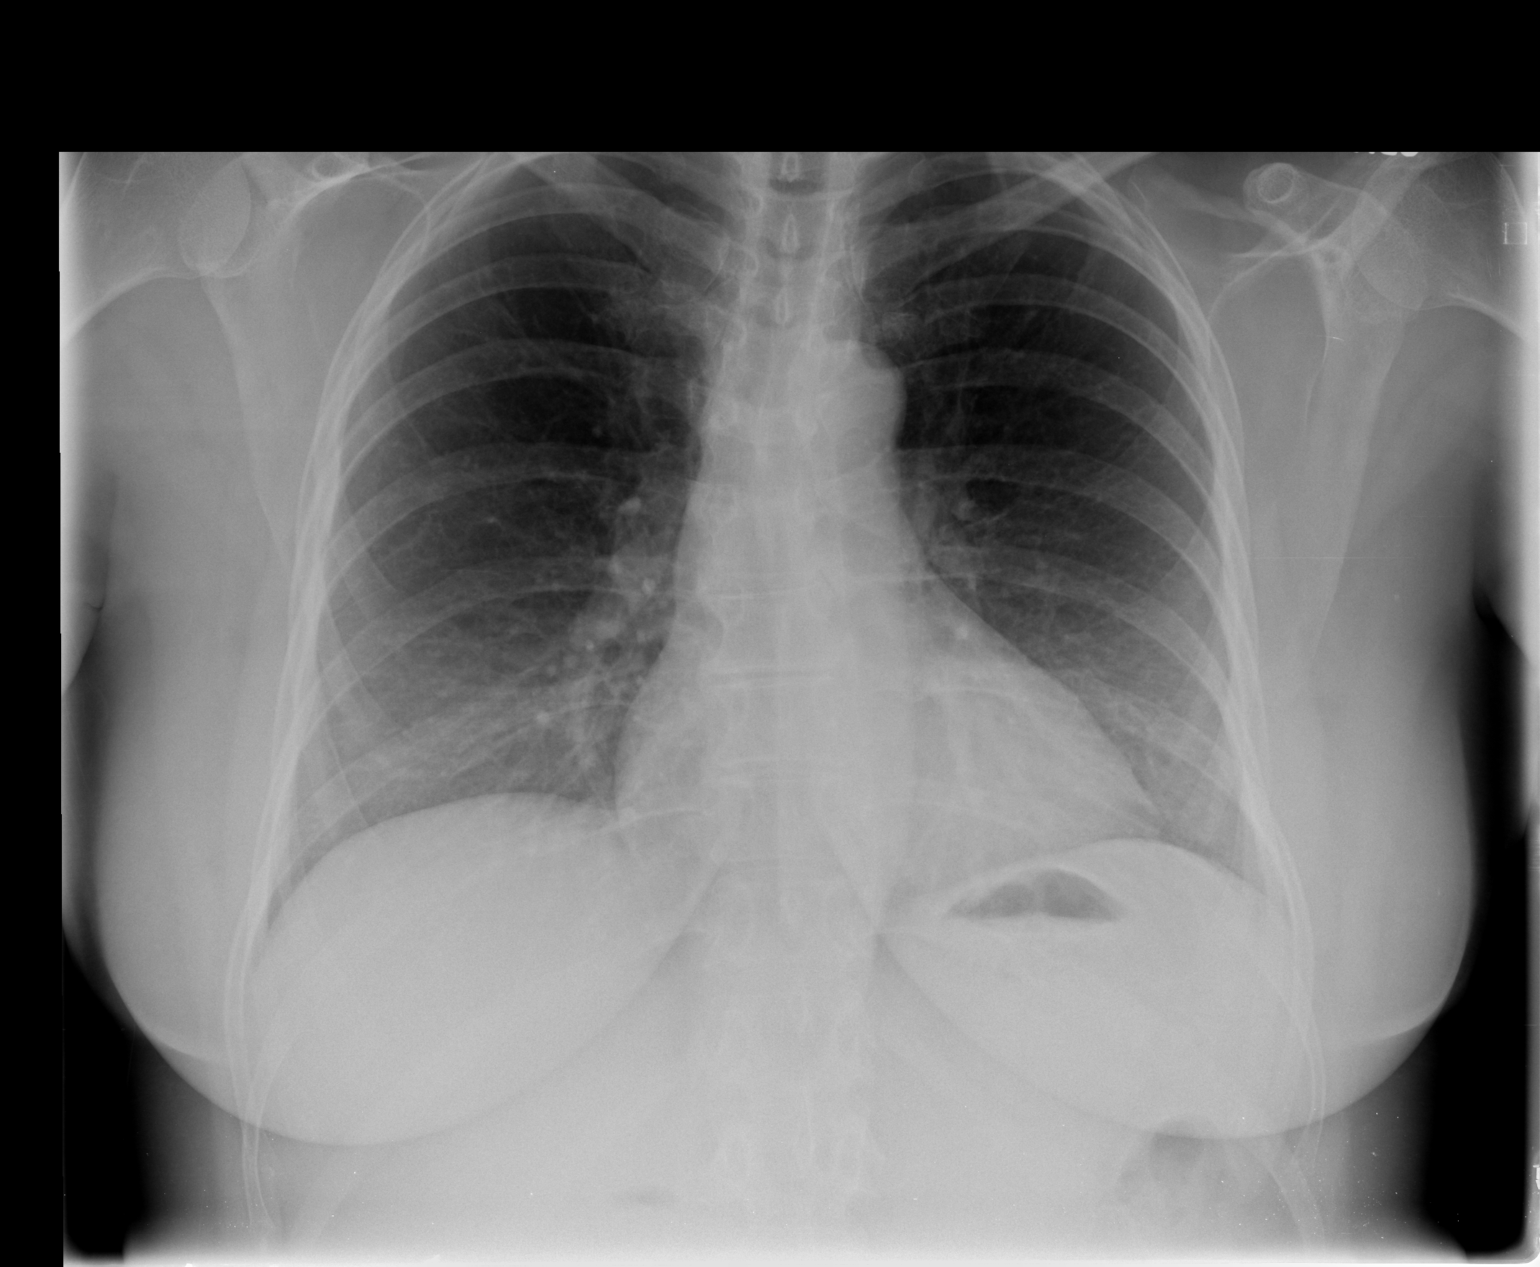

[view not recorded (2 of 2)]
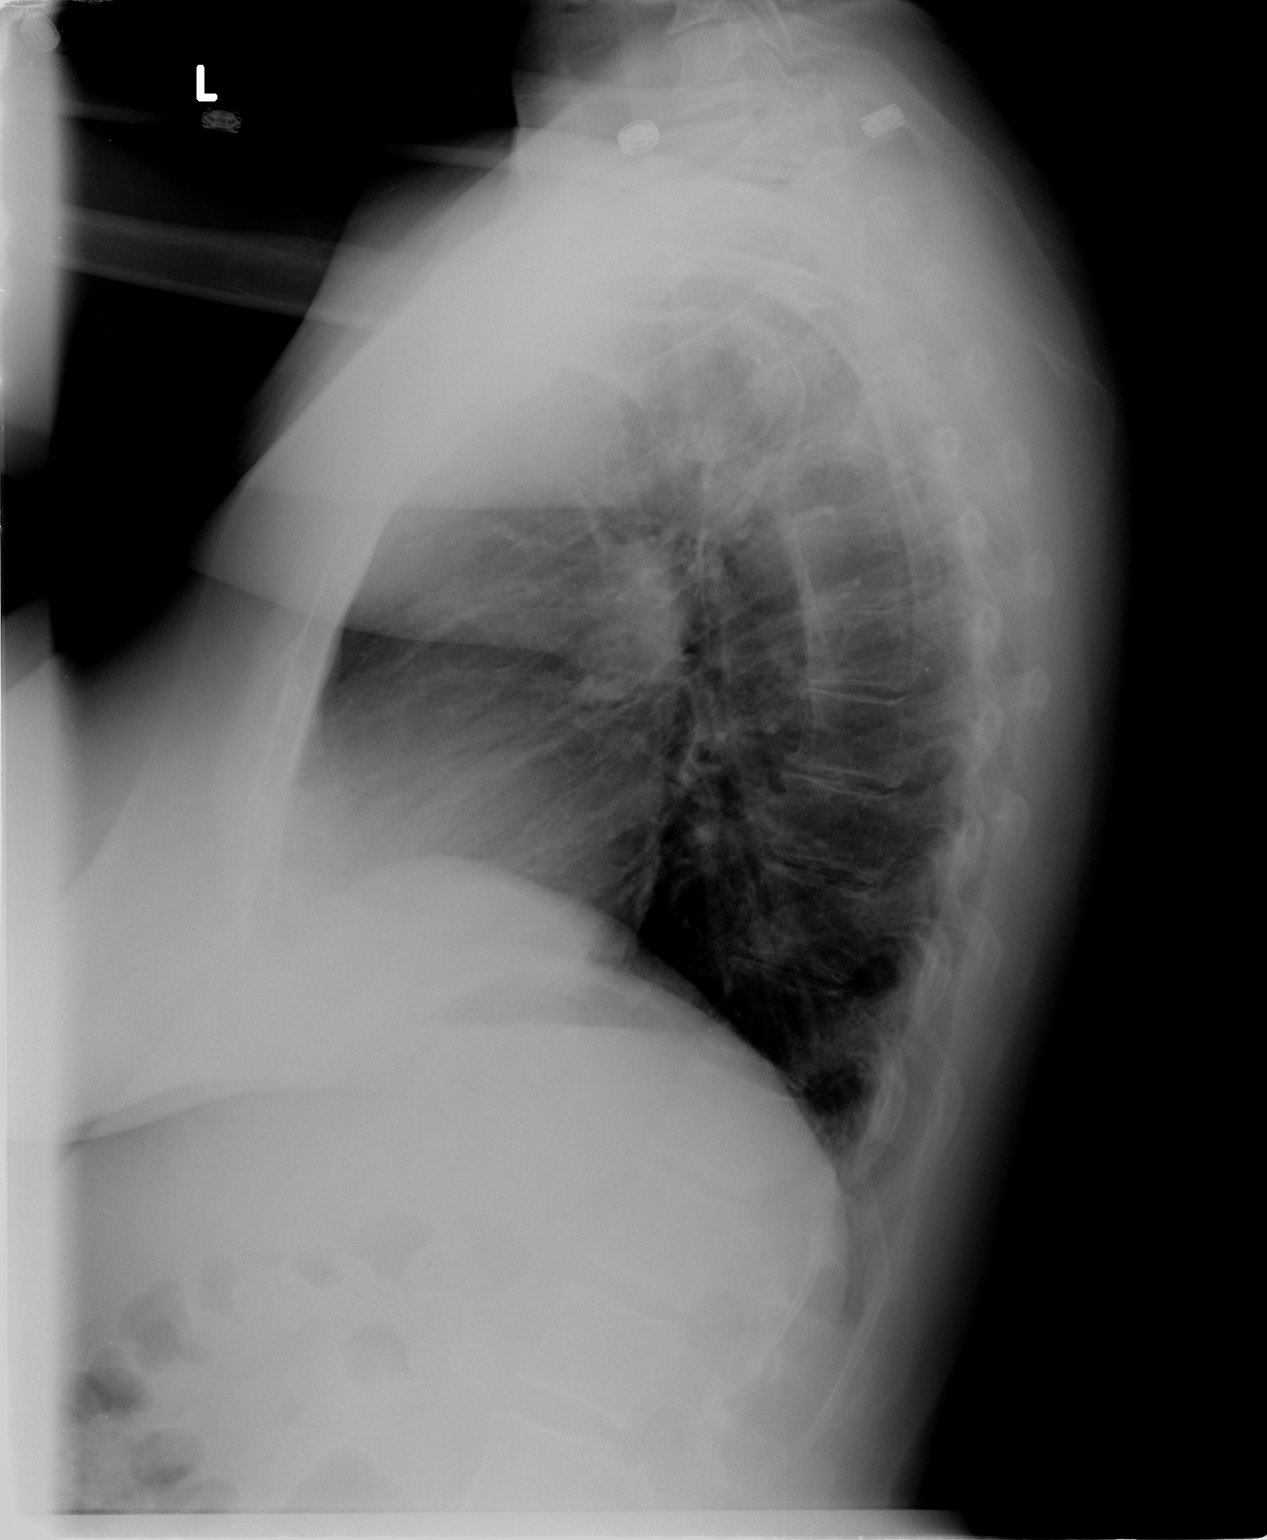

[2 of 2 positions shown; findings below may reference images not displayed]

FINDINGS: The cardiac silhouette, mediastinal and hilar contours
are within normal limits.  The lungs are clear.  No pleural
effusion.  The bony thorax is intact.
IMPRESSION: No acute cardiopulmonary findings.

## 2013-03-22 ENCOUNTER — Ambulatory Visit (HOSPITAL_COMMUNITY)
Admission: RE | Admit: 2013-03-22 | Discharge: 2013-03-22 | Disposition: A | Discharge status: Home / Self Care | Payer: 59 | Source: Ambulatory Visit | Attending: Family Medicine | Admitting: Family Medicine

## 2013-03-22 DIAGNOSIS — Z780 Asymptomatic menopausal state: Secondary | ICD-10-CM

## 2013-03-22 DIAGNOSIS — Z13820 Encounter for screening for osteoporosis: Secondary | ICD-10-CM | POA: Insufficient documentation

## 2013-03-22 DIAGNOSIS — Z1231 Encounter for screening mammogram for malignant neoplasm of breast: Secondary | ICD-10-CM | POA: Insufficient documentation

## 2013-09-15 ENCOUNTER — Other Ambulatory Visit: Payer: Self-pay | Admitting: Family Medicine

## 2013-09-15 ENCOUNTER — Other Ambulatory Visit (HOSPITAL_COMMUNITY)
Admission: RE | Admit: 2013-09-15 | Discharge: 2013-09-15 | Disposition: A | Discharge status: Home / Self Care | Payer: 59 | Source: Ambulatory Visit | Attending: Family Medicine | Admitting: Family Medicine

## 2013-09-15 DIAGNOSIS — Z0000 Encounter for general adult medical examination without abnormal findings: Secondary | ICD-10-CM | POA: Insufficient documentation

## 2014-02-14 ENCOUNTER — Other Ambulatory Visit (HOSPITAL_COMMUNITY): Payer: Self-pay | Admitting: Endocrinology

## 2014-02-14 DIAGNOSIS — Z1231 Encounter for screening mammogram for malignant neoplasm of breast: Secondary | ICD-10-CM

## 2014-03-28 ENCOUNTER — Ambulatory Visit (HOSPITAL_COMMUNITY)
Admission: RE | Admit: 2014-03-28 | Discharge: 2014-03-28 | Disposition: A | Discharge status: Home / Self Care | Payer: 59 | Source: Ambulatory Visit | Attending: Endocrinology | Admitting: Endocrinology

## 2014-03-28 DIAGNOSIS — Z1231 Encounter for screening mammogram for malignant neoplasm of breast: Secondary | ICD-10-CM | POA: Diagnosis not present

## 2015-02-25 ENCOUNTER — Other Ambulatory Visit: Payer: Self-pay

## 2015-02-25 ENCOUNTER — Other Ambulatory Visit (HOSPITAL_COMMUNITY): Payer: Self-pay | Admitting: Obstetrics and Gynecology

## 2015-02-25 DIAGNOSIS — Z1231 Encounter for screening mammogram for malignant neoplasm of breast: Secondary | ICD-10-CM

## 2015-04-03 ENCOUNTER — Ambulatory Visit
Admission: RE | Admit: 2015-04-03 | Discharge: 2015-04-03 | Disposition: A | Discharge status: Home / Self Care | Payer: 59 | Source: Ambulatory Visit

## 2015-04-03 DIAGNOSIS — Z1231 Encounter for screening mammogram for malignant neoplasm of breast: Secondary | ICD-10-CM

## 2015-07-02 MED FILL — QSYMIA 11.25 MG-69 MG CAP: 11.25-69 | 30 days supply | Qty: 30 | Fill #3

## 2015-07-17 MED FILL — DULoxetine HCL 20 MG CPEP: 20 | 30 days supply | Qty: 60 | Fill #2

## 2015-07-29 MED FILL — ZOLPIDEM TARTRATE 5 MG TAB: 5 | 30 days supply | Qty: 30 | Fill #1

## 2015-08-02 MED FILL — QSYMIA 11.25 MG-69 MG CAP: 11.25-69 | 30 days supply | Qty: 30 | Fill #4

## 2015-08-19 MED FILL — DULoxetine HCL 20 MG CPEP: 20 | 30 days supply | Qty: 60 | Fill #3

## 2015-08-28 MED FILL — ZOLPIDEM TARTRATE 5 MG TAB: 5 | 30 days supply | Qty: 30 | Fill #2

## 2015-09-02 MED FILL — QSYMIA 11.25 MG-69 MG CAP: 11.25-69 | 30 days supply | Qty: 30 | Fill #5

## 2015-09-03 MED FILL — SIMVASTATIN 40 MG TABLET: 40 | 90 days supply | Qty: 90 | Fill #0

## 2015-09-10 DIAGNOSIS — D126 Benign neoplasm of colon, unspecified: Secondary | ICD-10-CM | POA: Diagnosis not present

## 2015-09-10 DIAGNOSIS — E669 Obesity, unspecified: Secondary | ICD-10-CM | POA: Diagnosis not present

## 2015-09-10 DIAGNOSIS — I10 Essential (primary) hypertension: Secondary | ICD-10-CM | POA: Diagnosis not present

## 2015-09-10 DIAGNOSIS — E119 Type 2 diabetes mellitus without complications: Secondary | ICD-10-CM | POA: Diagnosis not present

## 2015-09-10 DIAGNOSIS — Z6829 Body mass index (BMI) 29.0-29.9, adult: Secondary | ICD-10-CM | POA: Diagnosis not present

## 2015-09-10 DIAGNOSIS — E784 Other hyperlipidemia: Secondary | ICD-10-CM | POA: Diagnosis not present

## 2015-09-10 DIAGNOSIS — Z1389 Encounter for screening for other disorder: Secondary | ICD-10-CM | POA: Diagnosis not present

## 2015-09-17 MED FILL — DULoxetine HCL 20 MG CPEP: 20 | 30 days supply | Qty: 60 | Fill #4

## 2015-09-25 MED FILL — ZOLPIDEM TARTRATE 5 MG TAB: 5 | 30 days supply | Qty: 30 | Fill #3

## 2015-10-02 MED FILL — ATENOLOL 25 MG TABLET: 25 | 90 days supply | Qty: 90 | Fill #1

## 2015-10-02 MED FILL — QSYMIA 11.25 MG-69 MG CAP: 11.25-69 | 30 days supply | Qty: 30 | Fill #0

## 2015-10-17 MED FILL — DULoxetine HCL 20 MG CPEP: 20 | 30 days supply | Qty: 60 | Fill #5

## 2015-10-24 MED FILL — ZOLPIDEM TARTRATE 5 MG TAB: 5 | 30 days supply | Qty: 30 | Fill #4

## 2015-10-31 MED FILL — QSYMIA 11.25 MG-69 MG CAP: 11.25-69 | 30 days supply | Qty: 30 | Fill #1

## 2015-11-18 MED FILL — DULoxetine HCL 20 MG CPEP: 20 | 30 days supply | Qty: 60 | Fill #0

## 2015-11-26 MED FILL — ZOLPIDEM TARTRATE 5 MG TAB: 5 | 30 days supply | Qty: 30 | Fill #5

## 2015-12-03 MED FILL — QSYMIA 11.25 MG-69 MG CAP: 11.25-69 | 30 days supply | Qty: 30 | Fill #2

## 2015-12-03 MED FILL — SIMVASTATIN 40 MG TABLET: 40 | 90 days supply | Qty: 90 | Fill #1

## 2015-12-19 MED FILL — DULoxetine HCL 20 MG CPEP: 20 | 30 days supply | Qty: 60 | Fill #1

## 2015-12-25 MED FILL — ZOLPIDEM TARTRATE 5 MG TAB: 5 | 30 days supply | Qty: 30 | Fill #0

## 2016-01-01 MED FILL — QSYMIA 11.25 MG-69 MG CAP: 11.25-69 | 30 days supply | Qty: 30 | Fill #3

## 2016-01-16 MED FILL — DULoxetine HCL 20 MG CPEP: 20 | 30 days supply | Qty: 60 | Fill #2

## 2016-01-21 DIAGNOSIS — D126 Benign neoplasm of colon, unspecified: Secondary | ICD-10-CM | POA: Diagnosis not present

## 2016-01-21 DIAGNOSIS — E784 Other hyperlipidemia: Secondary | ICD-10-CM | POA: Diagnosis not present

## 2016-01-21 DIAGNOSIS — E119 Type 2 diabetes mellitus without complications: Secondary | ICD-10-CM | POA: Diagnosis not present

## 2016-01-21 DIAGNOSIS — Z6829 Body mass index (BMI) 29.0-29.9, adult: Secondary | ICD-10-CM | POA: Diagnosis not present

## 2016-01-21 DIAGNOSIS — I10 Essential (primary) hypertension: Secondary | ICD-10-CM | POA: Diagnosis not present

## 2016-01-21 DIAGNOSIS — G43909 Migraine, unspecified, not intractable, without status migrainosus: Secondary | ICD-10-CM | POA: Diagnosis not present

## 2016-01-21 DIAGNOSIS — E668 Other obesity: Secondary | ICD-10-CM | POA: Diagnosis not present

## 2016-01-22 MED FILL — ZOLPIDEM TARTRATE 5 MG TAB: 5 | 30 days supply | Qty: 30 | Fill #1

## 2016-01-31 MED FILL — QSYMIA 11.25 MG-69 MG CAP: 11.25-69 | 30 days supply | Qty: 30 | Fill #4

## 2016-02-14 MED FILL — DULoxetine HCL 20 MG CPEP: 20 | 30 days supply | Qty: 60 | Fill #3

## 2016-02-24 MED FILL — ZOLPIDEM TARTRATE 5 MG TAB: 5 | 30 days supply | Qty: 30 | Fill #2

## 2016-02-28 ENCOUNTER — Other Ambulatory Visit: Payer: Self-pay | Admitting: Endocrinology

## 2016-02-28 DIAGNOSIS — Z1231 Encounter for screening mammogram for malignant neoplasm of breast: Secondary | ICD-10-CM

## 2016-02-28 MED FILL — QSYMIA 11.25 MG-69 MG CAP: 11.25-69 | 30 days supply | Qty: 30 | Fill #5

## 2016-03-03 MED FILL — SIMVASTATIN 40 MG TABLET: 40 | 90 days supply | Qty: 90 | Fill #2

## 2016-03-18 MED FILL — DULoxetine HCL 20 MG CPEP: 20 | 30 days supply | Qty: 60 | Fill #4

## 2016-03-25 MED FILL — ZOLPIDEM TARTRATE 5 MG TAB: 5 | 30 days supply | Qty: 30 | Fill #3

## 2016-03-25 MED FILL — ATENOLOL 25 MG TABLET: 25 | 90 days supply | Qty: 90 | Fill #2

## 2016-03-30 MED FILL — QSYMIA 11.25 MG-69 MG CAP: 11.25-69 | 30 days supply | Qty: 30 | Fill #0

## 2016-04-08 ENCOUNTER — Ambulatory Visit
Admission: RE | Admit: 2016-04-08 | Discharge: 2016-04-08 | Disposition: A | Discharge status: Home / Self Care | Payer: 59 | Source: Ambulatory Visit | Attending: Endocrinology | Admitting: Endocrinology

## 2016-04-08 DIAGNOSIS — Z1231 Encounter for screening mammogram for malignant neoplasm of breast: Secondary | ICD-10-CM

## 2016-04-13 DIAGNOSIS — H52221 Regular astigmatism, right eye: Secondary | ICD-10-CM | POA: Diagnosis not present

## 2016-04-13 DIAGNOSIS — H5213 Myopia, bilateral: Secondary | ICD-10-CM | POA: Diagnosis not present

## 2016-04-13 DIAGNOSIS — E119 Type 2 diabetes mellitus without complications: Secondary | ICD-10-CM | POA: Diagnosis not present

## 2016-04-13 DIAGNOSIS — H524 Presbyopia: Secondary | ICD-10-CM | POA: Diagnosis not present

## 2016-04-13 DIAGNOSIS — Z7984 Long term (current) use of oral hypoglycemic drugs: Secondary | ICD-10-CM | POA: Diagnosis not present

## 2016-04-15 MED FILL — DULoxetine HCL 20 MG CPEP: 20 | 30 days supply | Qty: 60 | Fill #5

## 2016-04-23 MED FILL — ZOLPIDEM TARTRATE 5 MG TAB: 5 | 30 days supply | Qty: 30 | Fill #4

## 2016-04-27 DIAGNOSIS — Z6830 Body mass index (BMI) 30.0-30.9, adult: Secondary | ICD-10-CM | POA: Diagnosis not present

## 2016-04-27 DIAGNOSIS — B0229 Other postherpetic nervous system involvement: Secondary | ICD-10-CM | POA: Diagnosis not present

## 2016-04-27 MED FILL — valACYclovir HCL 1 GM TABS: 1 | 7 days supply | Qty: 21 | Fill #0

## 2016-04-27 MED FILL — GABAPENTIN 100 MG CAPSULE: 100 | 30 days supply | Qty: 60 | Fill #0

## 2016-04-30 MED FILL — QSYMIA 11.25 MG-69 MG CAP: 11.25-69 | 30 days supply | Qty: 30 | Fill #1

## 2016-05-18 MED FILL — DULoxetine HCL 20 MG CPEP: 20 | 30 days supply | Qty: 60 | Fill #0

## 2016-05-21 MED FILL — ZOLPIDEM TARTRATE 5 MG TAB: 5 | 30 days supply | Qty: 30 | Fill #5

## 2016-05-25 DIAGNOSIS — I10 Essential (primary) hypertension: Secondary | ICD-10-CM | POA: Diagnosis not present

## 2016-05-25 DIAGNOSIS — E785 Hyperlipidemia, unspecified: Secondary | ICD-10-CM | POA: Diagnosis not present

## 2016-05-25 DIAGNOSIS — Z6830 Body mass index (BMI) 30.0-30.9, adult: Secondary | ICD-10-CM | POA: Diagnosis not present

## 2016-05-25 DIAGNOSIS — E784 Other hyperlipidemia: Secondary | ICD-10-CM | POA: Diagnosis not present

## 2016-05-25 DIAGNOSIS — E668 Other obesity: Secondary | ICD-10-CM | POA: Diagnosis not present

## 2016-05-25 DIAGNOSIS — D126 Benign neoplasm of colon, unspecified: Secondary | ICD-10-CM | POA: Diagnosis not present

## 2016-05-25 DIAGNOSIS — E119 Type 2 diabetes mellitus without complications: Secondary | ICD-10-CM | POA: Diagnosis not present

## 2016-05-25 MED FILL — ONE TOUCH ULTRA TEST STRIPS: 90 days supply | Qty: 100 | Fill #0

## 2016-06-01 MED FILL — SIMVASTATIN 40 MG TABLET: 40 | 90 days supply | Qty: 90 | Fill #3

## 2016-06-01 MED FILL — QSYMIA 11.25 MG-69 MG CAP: 11.25-69 | 30 days supply | Qty: 30 | Fill #2

## 2016-06-19 MED FILL — DULoxetine HCL 20 MG CPEP: 20 | 30 days supply | Qty: 60 | Fill #1

## 2016-06-19 MED FILL — ZOLPIDEM TARTRATE 5 MG TAB: 5 | 30 days supply | Qty: 30 | Fill #0

## 2016-06-29 MED FILL — QSYMIA 11.25 MG-69 MG CAP: 11.25-69 | 30 days supply | Qty: 30 | Fill #3

## 2016-07-16 MED FILL — DULoxetine HCL 20 MG CPEP: 20 | 30 days supply | Qty: 60 | Fill #2

## 2016-07-22 MED FILL — ZOLPIDEM TARTRATE 5 MG TAB: 5 | 30 days supply | Qty: 30 | Fill #1

## 2016-07-31 MED FILL — QSYMIA 11.25 MG-69 MG CAP: 11.25-69 | 30 days supply | Qty: 30 | Fill #4

## 2016-08-17 MED FILL — DULoxetine HCL 20 MG CPEP: 20 | 30 days supply | Qty: 60 | Fill #3

## 2016-08-21 MED FILL — ZOLPIDEM TARTRATE 5 MG TAB: 5 | 30 days supply | Qty: 30 | Fill #2

## 2016-08-31 MED FILL — QSYMIA 11.25 MG-69 MG CAP: 11.25-69 | 30 days supply | Qty: 30 | Fill #5

## 2016-08-31 MED FILL — SIMVASTATIN 40 MG TABLET: 40 | 90 days supply | Qty: 90 | Fill #0

## 2016-09-02 DIAGNOSIS — E119 Type 2 diabetes mellitus without complications: Secondary | ICD-10-CM | POA: Diagnosis not present

## 2016-09-02 DIAGNOSIS — M5030 Other cervical disc degeneration, unspecified cervical region: Secondary | ICD-10-CM | POA: Diagnosis not present

## 2016-09-02 DIAGNOSIS — Z6829 Body mass index (BMI) 29.0-29.9, adult: Secondary | ICD-10-CM | POA: Diagnosis not present

## 2016-09-02 DIAGNOSIS — I10 Essential (primary) hypertension: Secondary | ICD-10-CM | POA: Diagnosis not present

## 2016-09-02 DIAGNOSIS — S134XXA Sprain of ligaments of cervical spine, initial encounter: Secondary | ICD-10-CM | POA: Diagnosis not present

## 2016-09-03 ENCOUNTER — Other Ambulatory Visit (HOSPITAL_COMMUNITY): Payer: Self-pay | Admitting: Internal Medicine

## 2016-09-03 ENCOUNTER — Ambulatory Visit (HOSPITAL_COMMUNITY)
Admission: RE | Admit: 2016-09-03 | Discharge: 2016-09-03 | Disposition: A | Discharge status: Home / Self Care | Payer: 59 | Source: Ambulatory Visit | Attending: Internal Medicine | Admitting: Internal Medicine

## 2016-09-03 DIAGNOSIS — S134XXA Sprain of ligaments of cervical spine, initial encounter: Secondary | ICD-10-CM | POA: Diagnosis not present

## 2016-09-03 DIAGNOSIS — M4312 Spondylolisthesis, cervical region: Secondary | ICD-10-CM | POA: Diagnosis not present

## 2016-09-03 DIAGNOSIS — M5030 Other cervical disc degeneration, unspecified cervical region: Secondary | ICD-10-CM | POA: Insufficient documentation

## 2016-09-03 DIAGNOSIS — M542 Cervicalgia: Secondary | ICD-10-CM | POA: Diagnosis not present

## 2016-09-03 DIAGNOSIS — X58XXXA Exposure to other specified factors, initial encounter: Secondary | ICD-10-CM | POA: Diagnosis not present

## 2016-09-03 DIAGNOSIS — M4802 Spinal stenosis, cervical region: Secondary | ICD-10-CM | POA: Diagnosis not present

## 2016-09-16 MED FILL — DULoxetine HCL 20 MG CPEP: 20 | 30 days supply | Qty: 60 | Fill #4

## 2016-09-21 DIAGNOSIS — M4722 Other spondylosis with radiculopathy, cervical region: Secondary | ICD-10-CM | POA: Diagnosis not present

## 2016-09-21 DIAGNOSIS — Z6829 Body mass index (BMI) 29.0-29.9, adult: Secondary | ICD-10-CM | POA: Diagnosis not present

## 2016-09-21 MED FILL — NAPROXEN 375 MG TABLET: 375 | 30 days supply | Qty: 60 | Fill #0

## 2016-09-21 MED FILL — ZOLPIDEM TARTRATE 5 MG TAB: 5 | 30 days supply | Qty: 30 | Fill #3

## 2016-09-29 DIAGNOSIS — I10 Essential (primary) hypertension: Secondary | ICD-10-CM | POA: Diagnosis not present

## 2016-09-29 DIAGNOSIS — Z6829 Body mass index (BMI) 29.0-29.9, adult: Secondary | ICD-10-CM | POA: Diagnosis not present

## 2016-09-29 DIAGNOSIS — E668 Other obesity: Secondary | ICD-10-CM | POA: Diagnosis not present

## 2016-09-29 DIAGNOSIS — E784 Other hyperlipidemia: Secondary | ICD-10-CM | POA: Diagnosis not present

## 2016-09-29 DIAGNOSIS — D126 Benign neoplasm of colon, unspecified: Secondary | ICD-10-CM | POA: Diagnosis not present

## 2016-09-29 DIAGNOSIS — R3121 Asymptomatic microscopic hematuria: Secondary | ICD-10-CM | POA: Diagnosis not present

## 2016-09-29 DIAGNOSIS — E119 Type 2 diabetes mellitus without complications: Secondary | ICD-10-CM | POA: Diagnosis not present

## 2016-09-29 MED FILL — QSYMIA 11.25 MG-69 MG CAP: 11.25-69 | 30 days supply | Qty: 30 | Fill #0

## 2016-10-16 MED FILL — DULoxetine HCL 20 MG CPEP: 20 | 30 days supply | Qty: 60 | Fill #5

## 2016-10-19 DIAGNOSIS — M4722 Other spondylosis with radiculopathy, cervical region: Secondary | ICD-10-CM | POA: Diagnosis not present

## 2016-10-19 DIAGNOSIS — Z6829 Body mass index (BMI) 29.0-29.9, adult: Secondary | ICD-10-CM | POA: Diagnosis not present

## 2016-10-19 DIAGNOSIS — R030 Elevated blood-pressure reading, without diagnosis of hypertension: Secondary | ICD-10-CM | POA: Diagnosis not present

## 2016-10-19 MED FILL — NAPROXEN 375 MG TABLET: 375 | 30 days supply | Qty: 60 | Fill #0

## 2016-10-19 MED FILL — ZOLPIDEM TARTRATE 5 MG TAB: 5 | 30 days supply | Qty: 30 | Fill #4

## 2016-10-29 MED FILL — QSYMIA 11.25 MG-69 MG CAP: 11.25-69 | 30 days supply | Qty: 30 | Fill #1

## 2016-11-02 MED FILL — CEFDINIR 300 MG CAPSULE: 300 | 10 days supply | Qty: 20 | Fill #0

## 2016-11-05 MED FILL — ATENOLOL 25 MG TABLET: 25 | 90 days supply | Qty: 90 | Fill #0

## 2016-11-16 MED FILL — DULoxetine HCL 20 MG CPEP: 20 | 30 days supply | Qty: 60 | Fill #0

## 2016-11-19 MED FILL — ZOLPIDEM TARTRATE 5 MG TAB: 5 | 30 days supply | Qty: 30 | Fill #5

## 2016-11-30 MED FILL — QSYMIA 11.25 MG-69 MG CAP: 11.25-69 | 30 days supply | Qty: 30 | Fill #2

## 2016-11-30 MED FILL — SIMVASTATIN 40 MG TABLET: 40 | 90 days supply | Qty: 90 | Fill #1

## 2016-12-16 MED FILL — DULoxetine HCL 20 MG CPEP: 20 | 30 days supply | Qty: 60 | Fill #1

## 2016-12-22 MED FILL — ZOLPIDEM TARTRATE 5 MG TAB: 5 | 30 days supply | Qty: 30 | Fill #0

## 2016-12-29 MED FILL — QSYMIA 11.25 MG-69 MG CAP: 11.25-69 | 30 days supply | Qty: 30 | Fill #3

## 2017-01-14 MED FILL — DULoxetine HCL 20 MG CPEP: 20 | 30 days supply | Qty: 60 | Fill #2

## 2017-01-19 MED FILL — ZOLPIDEM TARTRATE 5 MG TAB: 5 | 30 days supply | Qty: 30 | Fill #1

## 2017-01-27 MED FILL — QSYMIA 11.25 MG-69 MG CAP: 11.25-69 | 30 days supply | Qty: 30 | Fill #4

## 2017-02-08 DIAGNOSIS — E784 Other hyperlipidemia: Secondary | ICD-10-CM | POA: Diagnosis not present

## 2017-02-08 DIAGNOSIS — G43909 Migraine, unspecified, not intractable, without status migrainosus: Secondary | ICD-10-CM | POA: Diagnosis not present

## 2017-02-08 DIAGNOSIS — Z6829 Body mass index (BMI) 29.0-29.9, adult: Secondary | ICD-10-CM | POA: Diagnosis not present

## 2017-02-08 DIAGNOSIS — D126 Benign neoplasm of colon, unspecified: Secondary | ICD-10-CM | POA: Diagnosis not present

## 2017-02-08 DIAGNOSIS — E119 Type 2 diabetes mellitus without complications: Secondary | ICD-10-CM | POA: Diagnosis not present

## 2017-02-08 DIAGNOSIS — I10 Essential (primary) hypertension: Secondary | ICD-10-CM | POA: Diagnosis not present

## 2017-02-08 DIAGNOSIS — E668 Other obesity: Secondary | ICD-10-CM | POA: Diagnosis not present

## 2017-02-12 MED FILL — DULoxetine HCL 20 MG CPEP: 20 | 30 days supply | Qty: 60 | Fill #3

## 2017-02-19 MED FILL — ZOLPIDEM TARTRATE 5 MG TAB: 5 | 30 days supply | Qty: 30 | Fill #2

## 2017-02-22 DIAGNOSIS — Z01419 Encounter for gynecological examination (general) (routine) without abnormal findings: Secondary | ICD-10-CM | POA: Diagnosis not present

## 2017-02-22 DIAGNOSIS — Z6829 Body mass index (BMI) 29.0-29.9, adult: Secondary | ICD-10-CM | POA: Diagnosis not present

## 2017-02-22 DIAGNOSIS — Z124 Encounter for screening for malignant neoplasm of cervix: Secondary | ICD-10-CM | POA: Diagnosis not present

## 2017-02-24 MED FILL — QSYMIA 11.25 MG-69 MG CAP: 11.25-69 | 30 days supply | Qty: 30 | Fill #5

## 2017-02-24 MED FILL — SIMVASTATIN 40 MG TABLET: 40 | 90 days supply | Qty: 90 | Fill #2

## 2017-03-16 MED FILL — DULoxetine HCL 20 MG CPEP: 20 | 30 days supply | Qty: 60 | Fill #4

## 2017-03-16 MED FILL — NAPROXEN 375 MG TABLET: 375 | 30 days supply | Qty: 60 | Fill #1

## 2017-03-19 MED FILL — ZOLPIDEM TARTRATE 5 MG TAB: 5 | 30 days supply | Qty: 30 | Fill #3

## 2017-03-25 ENCOUNTER — Other Ambulatory Visit: Payer: Self-pay | Admitting: Endocrinology

## 2017-03-25 DIAGNOSIS — Z1231 Encounter for screening mammogram for malignant neoplasm of breast: Secondary | ICD-10-CM

## 2017-04-02 MED FILL — QSYMIA 11.25 MG-69 MG CAP: 11.25-69 | 30 days supply | Qty: 30 | Fill #0

## 2017-04-09 ENCOUNTER — Ambulatory Visit
Admission: RE | Admit: 2017-04-09 | Discharge: 2017-04-09 | Disposition: A | Discharge status: Home / Self Care | Payer: 59 | Source: Ambulatory Visit | Attending: Endocrinology | Admitting: Endocrinology

## 2017-04-09 DIAGNOSIS — Z1231 Encounter for screening mammogram for malignant neoplasm of breast: Secondary | ICD-10-CM | POA: Diagnosis not present

## 2017-04-12 DIAGNOSIS — H5213 Myopia, bilateral: Secondary | ICD-10-CM | POA: Diagnosis not present

## 2017-04-12 DIAGNOSIS — H524 Presbyopia: Secondary | ICD-10-CM | POA: Diagnosis not present

## 2017-04-12 DIAGNOSIS — H52221 Regular astigmatism, right eye: Secondary | ICD-10-CM | POA: Diagnosis not present

## 2017-04-15 MED FILL — DULoxetine HCL 20 MG CPEP: 20 | 30 days supply | Qty: 60 | Fill #5

## 2017-04-22 MED FILL — ZOLPIDEM TARTRATE 5 MG TAB: 5 | 30 days supply | Qty: 30 | Fill #4

## 2017-04-22 MED FILL — NAPROXEN 375 MG TABLET: 375 | 30 days supply | Qty: 60 | Fill #2

## 2017-05-05 MED FILL — QSYMIA 11.25 MG-69 MG CAP: 11.25-69 | 30 days supply | Qty: 30 | Fill #1

## 2017-05-13 MED FILL — DULoxetine HCL 20 MG CPEP: 20 | 90 days supply | Qty: 180 | Fill #0

## 2017-05-21 MED FILL — ZOLPIDEM TARTRATE 5 MG TAB: 5 | 30 days supply | Qty: 30 | Fill #5

## 2017-05-26 MED FILL — SIMVASTATIN 40 MG TABLET: 40 | 90 days supply | Qty: 90 | Fill #3

## 2017-06-03 MED FILL — ATENOLOL 25 MG TABLET: 25 | 90 days supply | Qty: 90 | Fill #1

## 2017-06-03 MED FILL — QSYMIA 11.25 MG-69 MG CAP: 11.25-69 | 30 days supply | Qty: 30 | Fill #2

## 2017-06-21 MED FILL — ZOLPIDEM TARTRATE 5 MG TAB: 5 | 30 days supply | Qty: 30 | Fill #0

## 2017-07-02 MED FILL — QSYMIA 11.25 MG-69 MG CAP: 11.25-69 | 30 days supply | Qty: 30 | Fill #3

## 2017-07-09 DIAGNOSIS — G43909 Migraine, unspecified, not intractable, without status migrainosus: Secondary | ICD-10-CM | POA: Diagnosis not present

## 2017-07-09 DIAGNOSIS — R3121 Asymptomatic microscopic hematuria: Secondary | ICD-10-CM | POA: Diagnosis not present

## 2017-07-09 DIAGNOSIS — I10 Essential (primary) hypertension: Secondary | ICD-10-CM | POA: Diagnosis not present

## 2017-07-09 DIAGNOSIS — E119 Type 2 diabetes mellitus without complications: Secondary | ICD-10-CM | POA: Diagnosis not present

## 2017-07-09 DIAGNOSIS — E7849 Other hyperlipidemia: Secondary | ICD-10-CM | POA: Diagnosis not present

## 2017-07-09 DIAGNOSIS — Z1389 Encounter for screening for other disorder: Secondary | ICD-10-CM | POA: Diagnosis not present

## 2017-07-09 DIAGNOSIS — Z6829 Body mass index (BMI) 29.0-29.9, adult: Secondary | ICD-10-CM | POA: Diagnosis not present

## 2017-07-09 DIAGNOSIS — D126 Benign neoplasm of colon, unspecified: Secondary | ICD-10-CM | POA: Diagnosis not present

## 2017-07-09 DIAGNOSIS — E668 Other obesity: Secondary | ICD-10-CM | POA: Diagnosis not present

## 2017-07-19 MED FILL — PEG-3350 SOLUTION: 420 | 1 days supply | Qty: 4000 | Fill #0

## 2017-07-21 MED FILL — ZOLPIDEM TARTRATE 5 MG TAB: 5 | 30 days supply | Qty: 30 | Fill #1

## 2017-07-26 DIAGNOSIS — Z86010 Personal history of colonic polyps: Secondary | ICD-10-CM | POA: Diagnosis not present

## 2017-08-03 MED FILL — QSYMIA 11.25 MG-69 MG CAP: 11.25-69 | 30 days supply | Qty: 30 | Fill #4

## 2017-08-13 MED FILL — DULoxetine HCL 20 MG CPEP: 20 | 90 days supply | Qty: 180 | Fill #1

## 2017-08-20 MED FILL — ZOLPIDEM TARTRATE 5 MG TAB: 5 | 30 days supply | Qty: 30 | Fill #2

## 2017-08-27 MED FILL — SIMVASTATIN 40 MG TABLET: 40 | 90 days supply | Qty: 90 | Fill #0

## 2017-09-01 MED FILL — QSYMIA 11.25 MG-69 MG CAP: 11.25-69 | 30 days supply | Qty: 30 | Fill #5

## 2017-09-20 MED FILL — ZOLPIDEM TARTRATE 5 MG TAB: 5 | 30 days supply | Qty: 30 | Fill #3

## 2017-09-20 MED FILL — ATENOLOL 25 MG TABLET: 25 | 90 days supply | Qty: 90 | Fill #0

## 2017-10-07 MED FILL — QSYMIA 11.25 MG-69 MG CAP: 11.25-69 | 30 days supply | Qty: 30 | Fill #0

## 2017-11-03 DIAGNOSIS — Z6829 Body mass index (BMI) 29.0-29.9, adult: Secondary | ICD-10-CM | POA: Diagnosis not present

## 2017-11-03 DIAGNOSIS — R3121 Asymptomatic microscopic hematuria: Secondary | ICD-10-CM | POA: Diagnosis not present

## 2017-11-03 DIAGNOSIS — N390 Urinary tract infection, site not specified: Secondary | ICD-10-CM | POA: Diagnosis not present

## 2017-11-03 DIAGNOSIS — D126 Benign neoplasm of colon, unspecified: Secondary | ICD-10-CM | POA: Diagnosis not present

## 2017-11-03 DIAGNOSIS — E668 Other obesity: Secondary | ICD-10-CM | POA: Diagnosis not present

## 2017-11-03 DIAGNOSIS — E7849 Other hyperlipidemia: Secondary | ICD-10-CM | POA: Diagnosis not present

## 2017-11-03 DIAGNOSIS — E119 Type 2 diabetes mellitus without complications: Secondary | ICD-10-CM | POA: Diagnosis not present

## 2017-11-03 DIAGNOSIS — I10 Essential (primary) hypertension: Secondary | ICD-10-CM | POA: Diagnosis not present

## 2017-11-12 MED FILL — QSYMIA 11.25 MG-69 MG CAP: 11.25-69 | 30 days supply | Qty: 30 | Fill #0

## 2017-11-16 MED FILL — DULoxetine HCL 20 MG CPEP: 20 | 90 days supply | Qty: 180 | Fill #0

## 2017-11-29 MED FILL — SIMVASTATIN 40 MG TABLET: 40 | 90 days supply | Qty: 90 | Fill #1

## 2017-12-13 MED FILL — QSYMIA 11.25 MG-69 MG CAP: 11.25-69 | 30 days supply | Qty: 30 | Fill #0

## 2018-01-12 MED FILL — QSYMIA 11.25 MG-69 MG CAP: 11.25-69 | 30 days supply | Qty: 30 | Fill #0

## 2018-01-26 MED FILL — NAPROXEN 375 MG TABLET: 375 | 30 days supply | Qty: 60 | Fill #0

## 2018-02-14 MED FILL — QSYMIA 11.25 MG-69 MG CAP: 11.25-69 | 30 days supply | Qty: 30 | Fill #1

## 2018-02-17 MED FILL — DULoxetine HCL 20 MG CPEP: 20 | 90 days supply | Qty: 180 | Fill #1

## 2018-03-01 MED FILL — SIMVASTATIN 40 MG TABLET: 40 | 90 days supply | Qty: 90 | Fill #2

## 2018-03-08 DIAGNOSIS — D126 Benign neoplasm of colon, unspecified: Secondary | ICD-10-CM | POA: Diagnosis not present

## 2018-03-08 DIAGNOSIS — E7849 Other hyperlipidemia: Secondary | ICD-10-CM | POA: Diagnosis not present

## 2018-03-08 DIAGNOSIS — M8588 Other specified disorders of bone density and structure, other site: Secondary | ICD-10-CM | POA: Diagnosis not present

## 2018-03-08 DIAGNOSIS — Z6830 Body mass index (BMI) 30.0-30.9, adult: Secondary | ICD-10-CM | POA: Diagnosis not present

## 2018-03-08 DIAGNOSIS — E119 Type 2 diabetes mellitus without complications: Secondary | ICD-10-CM | POA: Diagnosis not present

## 2018-03-08 DIAGNOSIS — Z124 Encounter for screening for malignant neoplasm of cervix: Secondary | ICD-10-CM | POA: Diagnosis not present

## 2018-03-08 DIAGNOSIS — E668 Other obesity: Secondary | ICD-10-CM | POA: Diagnosis not present

## 2018-03-08 DIAGNOSIS — Z1231 Encounter for screening mammogram for malignant neoplasm of breast: Secondary | ICD-10-CM | POA: Diagnosis not present

## 2018-03-08 DIAGNOSIS — Z01419 Encounter for gynecological examination (general) (routine) without abnormal findings: Secondary | ICD-10-CM | POA: Diagnosis not present

## 2018-03-08 DIAGNOSIS — M859 Disorder of bone density and structure, unspecified: Secondary | ICD-10-CM | POA: Diagnosis not present

## 2018-03-08 DIAGNOSIS — Z23 Encounter for immunization: Secondary | ICD-10-CM | POA: Diagnosis not present

## 2018-03-08 DIAGNOSIS — I10 Essential (primary) hypertension: Secondary | ICD-10-CM | POA: Diagnosis not present

## 2018-03-08 DIAGNOSIS — G43909 Migraine, unspecified, not intractable, without status migrainosus: Secondary | ICD-10-CM | POA: Diagnosis not present

## 2018-03-17 MED FILL — QSYMIA 11.25 MG-69 MG CAP: 11.25-69 | 30 days supply | Qty: 30 | Fill #0

## 2018-03-18 DIAGNOSIS — E119 Type 2 diabetes mellitus without complications: Secondary | ICD-10-CM | POA: Diagnosis not present

## 2018-03-18 DIAGNOSIS — H524 Presbyopia: Secondary | ICD-10-CM | POA: Diagnosis not present

## 2018-03-18 DIAGNOSIS — H5213 Myopia, bilateral: Secondary | ICD-10-CM | POA: Diagnosis not present

## 2018-04-04 MED FILL — NAPROXEN 375 MG TABS: 375 | 30 days supply | Qty: 60 | Fill #1

## 2018-04-15 MED FILL — QSYMIA 11.25 MG-69 MG CAP: 11.25-69 | 30 days supply | Qty: 30 | Fill #1

## 2018-05-16 MED FILL — QSYMIA 11.25 MG-69 MG CAP: 11.25-69 | 30 days supply | Qty: 30 | Fill #0

## 2018-05-16 MED FILL — DULoxetine HCL 20 MG CPEP: 20 | 90 days supply | Qty: 180 | Fill #0

## 2018-05-30 MED FILL — SIMVASTATIN 40 MG TABLET: 40 | 90 days supply | Qty: 90 | Fill #3

## 2018-06-14 MED FILL — QSYMIA 11.25 MG-69 MG CAP: 11.25-69 | 30 days supply | Qty: 30 | Fill #1

## 2018-07-13 MED FILL — QSYMIA 11.25 MG-69 MG CAP: 11.25-69 | 30 days supply | Qty: 30 | Fill #0

## 2018-07-19 DIAGNOSIS — E119 Type 2 diabetes mellitus without complications: Secondary | ICD-10-CM | POA: Diagnosis not present

## 2018-07-19 DIAGNOSIS — E669 Obesity, unspecified: Secondary | ICD-10-CM | POA: Diagnosis not present

## 2018-07-19 DIAGNOSIS — D126 Benign neoplasm of colon, unspecified: Secondary | ICD-10-CM | POA: Diagnosis not present

## 2018-07-19 DIAGNOSIS — E7849 Other hyperlipidemia: Secondary | ICD-10-CM | POA: Diagnosis not present

## 2018-07-19 DIAGNOSIS — G43909 Migraine, unspecified, not intractable, without status migrainosus: Secondary | ICD-10-CM | POA: Diagnosis not present

## 2018-07-19 DIAGNOSIS — M859 Disorder of bone density and structure, unspecified: Secondary | ICD-10-CM | POA: Diagnosis not present

## 2018-07-19 DIAGNOSIS — I10 Essential (primary) hypertension: Secondary | ICD-10-CM | POA: Diagnosis not present

## 2018-08-11 MED FILL — QSYMIA 11.25 MG-69 MG CAP: 11.25-69 | 30 days supply | Qty: 30 | Fill #1

## 2018-08-17 MED FILL — DULoxetine HCL 20 MG CPEP: 20 | 90 days supply | Qty: 180 | Fill #1

## 2018-08-25 MED FILL — SIMVASTATIN 40 MG TABLET: 40 | 90 days supply | Qty: 90 | Fill #0

## 2018-09-13 MED FILL — QSYMIA 11.25 MG-69 MG CAP: 11.25-69 | 30 days supply | Qty: 30 | Fill #0

## 2018-10-17 MED FILL — QSYMIA 11.25 MG-69 MG CAP: 11.25-69 | 30 days supply | Qty: 30 | Fill #0

## 2018-11-22 DIAGNOSIS — D126 Benign neoplasm of colon, unspecified: Secondary | ICD-10-CM | POA: Diagnosis not present

## 2018-11-22 DIAGNOSIS — M8580 Other specified disorders of bone density and structure, unspecified site: Secondary | ICD-10-CM | POA: Diagnosis not present

## 2018-11-22 DIAGNOSIS — Z1331 Encounter for screening for depression: Secondary | ICD-10-CM | POA: Diagnosis not present

## 2018-11-22 DIAGNOSIS — E785 Hyperlipidemia, unspecified: Secondary | ICD-10-CM | POA: Diagnosis not present

## 2018-11-22 DIAGNOSIS — E119 Type 2 diabetes mellitus without complications: Secondary | ICD-10-CM | POA: Diagnosis not present

## 2018-11-24 DIAGNOSIS — E7849 Other hyperlipidemia: Secondary | ICD-10-CM | POA: Diagnosis not present

## 2018-11-24 DIAGNOSIS — I10 Essential (primary) hypertension: Secondary | ICD-10-CM | POA: Diagnosis not present

## 2018-11-24 DIAGNOSIS — M859 Disorder of bone density and structure, unspecified: Secondary | ICD-10-CM | POA: Diagnosis not present

## 2018-12-20 DIAGNOSIS — D485 Neoplasm of uncertain behavior of skin: Secondary | ICD-10-CM | POA: Diagnosis not present

## 2018-12-20 DIAGNOSIS — D225 Melanocytic nevi of trunk: Secondary | ICD-10-CM | POA: Diagnosis not present

## 2018-12-20 DIAGNOSIS — D1801 Hemangioma of skin and subcutaneous tissue: Secondary | ICD-10-CM | POA: Diagnosis not present

## 2018-12-20 DIAGNOSIS — D2261 Melanocytic nevi of right upper limb, including shoulder: Secondary | ICD-10-CM | POA: Diagnosis not present

## 2018-12-20 DIAGNOSIS — D2262 Melanocytic nevi of left upper limb, including shoulder: Secondary | ICD-10-CM | POA: Diagnosis not present

## 2019-01-03 DIAGNOSIS — D485 Neoplasm of uncertain behavior of skin: Secondary | ICD-10-CM | POA: Diagnosis not present

## 2019-01-03 DIAGNOSIS — L988 Other specified disorders of the skin and subcutaneous tissue: Secondary | ICD-10-CM | POA: Diagnosis not present

## 2019-03-08 DIAGNOSIS — E119 Type 2 diabetes mellitus without complications: Secondary | ICD-10-CM | POA: Diagnosis not present

## 2019-03-08 DIAGNOSIS — I10 Essential (primary) hypertension: Secondary | ICD-10-CM | POA: Diagnosis not present

## 2019-03-08 DIAGNOSIS — G43909 Migraine, unspecified, not intractable, without status migrainosus: Secondary | ICD-10-CM | POA: Diagnosis not present

## 2019-03-08 DIAGNOSIS — E785 Hyperlipidemia, unspecified: Secondary | ICD-10-CM | POA: Diagnosis not present

## 2019-03-20 DIAGNOSIS — E119 Type 2 diabetes mellitus without complications: Secondary | ICD-10-CM | POA: Diagnosis not present

## 2019-04-06 DIAGNOSIS — Z23 Encounter for immunization: Secondary | ICD-10-CM | POA: Diagnosis not present

## 2019-04-07 DIAGNOSIS — E119 Type 2 diabetes mellitus without complications: Secondary | ICD-10-CM | POA: Diagnosis not present

## 2019-04-21 DIAGNOSIS — Z01419 Encounter for gynecological examination (general) (routine) without abnormal findings: Secondary | ICD-10-CM | POA: Diagnosis not present

## 2019-04-21 DIAGNOSIS — Z124 Encounter for screening for malignant neoplasm of cervix: Secondary | ICD-10-CM | POA: Diagnosis not present

## 2019-04-21 DIAGNOSIS — Z1231 Encounter for screening mammogram for malignant neoplasm of breast: Secondary | ICD-10-CM | POA: Diagnosis not present

## 2019-05-23 DIAGNOSIS — Z23 Encounter for immunization: Secondary | ICD-10-CM | POA: Diagnosis not present

## 2019-07-12 DIAGNOSIS — E119 Type 2 diabetes mellitus without complications: Secondary | ICD-10-CM | POA: Diagnosis not present

## 2019-07-12 DIAGNOSIS — E785 Hyperlipidemia, unspecified: Secondary | ICD-10-CM | POA: Diagnosis not present

## 2019-07-12 DIAGNOSIS — I10 Essential (primary) hypertension: Secondary | ICD-10-CM | POA: Diagnosis not present

## 2019-07-12 DIAGNOSIS — G43909 Migraine, unspecified, not intractable, without status migrainosus: Secondary | ICD-10-CM | POA: Diagnosis not present

## 2019-07-13 DIAGNOSIS — E119 Type 2 diabetes mellitus without complications: Secondary | ICD-10-CM | POA: Diagnosis not present

## 2019-07-15 DIAGNOSIS — E119 Type 2 diabetes mellitus without complications: Secondary | ICD-10-CM | POA: Diagnosis not present

## 2019-07-20 DIAGNOSIS — L0889 Other specified local infections of the skin and subcutaneous tissue: Secondary | ICD-10-CM | POA: Diagnosis not present

## 2019-07-20 DIAGNOSIS — L738 Other specified follicular disorders: Secondary | ICD-10-CM | POA: Diagnosis not present

## 2019-07-31 DIAGNOSIS — L0889 Other specified local infections of the skin and subcutaneous tissue: Secondary | ICD-10-CM | POA: Diagnosis not present

## 2019-07-31 DIAGNOSIS — L309 Dermatitis, unspecified: Secondary | ICD-10-CM | POA: Diagnosis not present

## 2019-08-22 DIAGNOSIS — Z23 Encounter for immunization: Secondary | ICD-10-CM | POA: Diagnosis not present

## 2019-08-24 DIAGNOSIS — L0889 Other specified local infections of the skin and subcutaneous tissue: Secondary | ICD-10-CM | POA: Diagnosis not present

## 2019-08-24 DIAGNOSIS — L738 Other specified follicular disorders: Secondary | ICD-10-CM | POA: Diagnosis not present

## 2019-08-24 DIAGNOSIS — L308 Other specified dermatitis: Secondary | ICD-10-CM | POA: Diagnosis not present

## 2019-09-01 DIAGNOSIS — I10 Essential (primary) hypertension: Secondary | ICD-10-CM | POA: Diagnosis not present

## 2019-09-01 DIAGNOSIS — E7849 Other hyperlipidemia: Secondary | ICD-10-CM | POA: Diagnosis not present

## 2019-09-01 DIAGNOSIS — E119 Type 2 diabetes mellitus without complications: Secondary | ICD-10-CM | POA: Diagnosis not present

## 2019-09-01 DIAGNOSIS — R21 Rash and other nonspecific skin eruption: Secondary | ICD-10-CM | POA: Diagnosis not present

## 2019-09-06 DIAGNOSIS — L308 Other specified dermatitis: Secondary | ICD-10-CM | POA: Diagnosis not present

## 2019-09-06 DIAGNOSIS — R21 Rash and other nonspecific skin eruption: Secondary | ICD-10-CM | POA: Diagnosis not present

## 2019-09-06 DIAGNOSIS — L988 Other specified disorders of the skin and subcutaneous tissue: Secondary | ICD-10-CM | POA: Diagnosis not present

## 2019-09-20 DIAGNOSIS — L080 Pyoderma: Secondary | ICD-10-CM | POA: Diagnosis not present

## 2019-09-21 ENCOUNTER — Ambulatory Visit: Payer: Self-pay | Attending: Internal Medicine

## 2019-09-21 DIAGNOSIS — Z23 Encounter for immunization: Secondary | ICD-10-CM

## 2019-09-21 NOTE — Progress Notes (Signed)
   Covid-19 Vaccination Clinic  Name:  GIANNAH KUILAN    MRN: [id number] DOB: March 05, 1961  09/21/2019  Ms. Mccay was observed post Covid-19 immunization for 15 minutes without incident. She was provided with Vaccine Information Sheet and instruction to access the V-Safe system.   Ms. Coffel was instructed to call 911 with any severe reactions post vaccine: Marland Kitchen Difficulty breathing  . Swelling of face and throat  . A fast heartbeat  . A bad rash all over body  . Dizziness and weakness   Immunizations Administered    Name Date Dose VIS Date Route   Pfizer COVID-19 Vaccine 09/21/2019  8:20 AM 0.3 mL 06/09/2019 Intramuscular   Manufacturer: [place]   Lot: [id number]   [place]: [id number]

## 2019-09-23 ENCOUNTER — Ambulatory Visit: Payer: 59

## 2019-10-16 ENCOUNTER — Ambulatory Visit: Payer: Self-pay | Attending: Internal Medicine

## 2019-10-16 DIAGNOSIS — Z23 Encounter for immunization: Secondary | ICD-10-CM

## 2019-10-16 NOTE — Progress Notes (Signed)
   Covid-19 Vaccination Clinic  Name:  Connie Gonzales    MRN: [id number] DOB: 03/26/1961  10/16/2019  [name] was observed post Covid-19 immunization for 15 minutes without incident. She was provided with Vaccine Information Sheet and instruction to access the V-Safe system.   [name] was instructed to call 911 with any severe reactions post vaccine: Marland Kitchen Difficulty breathing  . Swelling of face and throat  . A fast heartbeat  . A bad rash all over body  . Dizziness and weakness   Immunizations Administered    Name Date Dose VIS Date Route   Pfizer COVID-19 Vaccine 10/16/2019  8:16 AM 0.3 mL 08/23/2018 Intramuscular   Manufacturer: [place]   Lot: [id number]   [place]: [id number]

## 2019-10-23 DIAGNOSIS — L309 Dermatitis, unspecified: Secondary | ICD-10-CM | POA: Diagnosis not present

## 2019-10-23 DIAGNOSIS — Z5181 Encounter for therapeutic drug level monitoring: Secondary | ICD-10-CM | POA: Diagnosis not present

## 2019-10-23 DIAGNOSIS — Z79899 Other long term (current) drug therapy: Secondary | ICD-10-CM | POA: Diagnosis not present

## 2019-11-13 DIAGNOSIS — M859 Disorder of bone density and structure, unspecified: Secondary | ICD-10-CM | POA: Diagnosis not present

## 2019-11-13 DIAGNOSIS — R21 Rash and other nonspecific skin eruption: Secondary | ICD-10-CM | POA: Diagnosis not present

## 2019-11-13 DIAGNOSIS — E669 Obesity, unspecified: Secondary | ICD-10-CM | POA: Diagnosis not present

## 2019-11-13 DIAGNOSIS — E119 Type 2 diabetes mellitus without complications: Secondary | ICD-10-CM | POA: Diagnosis not present

## 2019-11-20 DIAGNOSIS — Z5181 Encounter for therapeutic drug level monitoring: Secondary | ICD-10-CM | POA: Diagnosis not present

## 2019-11-20 DIAGNOSIS — L309 Dermatitis, unspecified: Secondary | ICD-10-CM | POA: Diagnosis not present

## 2019-11-20 DIAGNOSIS — L080 Pyoderma: Secondary | ICD-10-CM | POA: Diagnosis not present

## 2019-11-20 DIAGNOSIS — Z79899 Other long term (current) drug therapy: Secondary | ICD-10-CM | POA: Diagnosis not present

## 2020-01-15 DIAGNOSIS — L080 Pyoderma: Secondary | ICD-10-CM | POA: Diagnosis not present

## 2020-01-15 DIAGNOSIS — Z5181 Encounter for therapeutic drug level monitoring: Secondary | ICD-10-CM | POA: Diagnosis not present

## 2020-03-19 DIAGNOSIS — E119 Type 2 diabetes mellitus without complications: Secondary | ICD-10-CM | POA: Diagnosis not present

## 2020-03-19 DIAGNOSIS — D126 Benign neoplasm of colon, unspecified: Secondary | ICD-10-CM | POA: Diagnosis not present

## 2020-03-24 DIAGNOSIS — Z20822 Contact with and (suspected) exposure to covid-19: Secondary | ICD-10-CM | POA: Diagnosis not present

## 2020-04-08 DIAGNOSIS — H5213 Myopia, bilateral: Secondary | ICD-10-CM | POA: Diagnosis not present

## 2020-04-08 DIAGNOSIS — E119 Type 2 diabetes mellitus without complications: Secondary | ICD-10-CM | POA: Diagnosis not present

## 2020-04-13 DIAGNOSIS — Z23 Encounter for immunization: Secondary | ICD-10-CM | POA: Diagnosis not present

## 2020-04-22 DIAGNOSIS — Z01419 Encounter for gynecological examination (general) (routine) without abnormal findings: Secondary | ICD-10-CM | POA: Diagnosis not present

## 2020-04-22 DIAGNOSIS — Z13820 Encounter for screening for osteoporosis: Secondary | ICD-10-CM | POA: Diagnosis not present

## 2020-04-22 DIAGNOSIS — Z1231 Encounter for screening mammogram for malignant neoplasm of breast: Secondary | ICD-10-CM | POA: Diagnosis not present

## 2020-05-15 DIAGNOSIS — M8589 Other specified disorders of bone density and structure, multiple sites: Secondary | ICD-10-CM | POA: Diagnosis not present

## 2020-07-19 DIAGNOSIS — I10 Essential (primary) hypertension: Secondary | ICD-10-CM | POA: Diagnosis not present

## 2020-07-19 DIAGNOSIS — E119 Type 2 diabetes mellitus without complications: Secondary | ICD-10-CM | POA: Diagnosis not present

## 2020-08-26 DIAGNOSIS — Z5181 Encounter for therapeutic drug level monitoring: Secondary | ICD-10-CM | POA: Diagnosis not present

## 2020-08-26 DIAGNOSIS — L080 Pyoderma: Secondary | ICD-10-CM | POA: Diagnosis not present

## 2020-12-23 DIAGNOSIS — Z5181 Encounter for therapeutic drug level monitoring: Secondary | ICD-10-CM | POA: Diagnosis not present

## 2020-12-23 DIAGNOSIS — Z79899 Other long term (current) drug therapy: Secondary | ICD-10-CM | POA: Diagnosis not present

## 2020-12-23 DIAGNOSIS — L080 Pyoderma: Secondary | ICD-10-CM | POA: Diagnosis not present

## 2021-04-05 DIAGNOSIS — Z23 Encounter for immunization: Secondary | ICD-10-CM | POA: Diagnosis not present

## 2021-04-21 DIAGNOSIS — L080 Pyoderma: Secondary | ICD-10-CM | POA: Diagnosis not present

## 2021-04-21 DIAGNOSIS — Z5181 Encounter for therapeutic drug level monitoring: Secondary | ICD-10-CM | POA: Diagnosis not present

## 2021-04-21 DIAGNOSIS — Z79899 Other long term (current) drug therapy: Secondary | ICD-10-CM | POA: Diagnosis not present

## 2021-05-15 DIAGNOSIS — Z01419 Encounter for gynecological examination (general) (routine) without abnormal findings: Secondary | ICD-10-CM | POA: Diagnosis not present

## 2021-05-15 DIAGNOSIS — R87610 Atypical squamous cells of undetermined significance on cytologic smear of cervix (ASC-US): Secondary | ICD-10-CM | POA: Diagnosis not present

## 2021-05-15 DIAGNOSIS — Z1231 Encounter for screening mammogram for malignant neoplasm of breast: Secondary | ICD-10-CM | POA: Diagnosis not present

## 2021-07-21 DIAGNOSIS — I10 Essential (primary) hypertension: Secondary | ICD-10-CM | POA: Diagnosis not present

## 2021-07-21 DIAGNOSIS — M8580 Other specified disorders of bone density and structure, unspecified site: Secondary | ICD-10-CM | POA: Diagnosis not present

## 2021-07-21 DIAGNOSIS — E785 Hyperlipidemia, unspecified: Secondary | ICD-10-CM | POA: Diagnosis not present

## 2021-07-21 DIAGNOSIS — E119 Type 2 diabetes mellitus without complications: Secondary | ICD-10-CM | POA: Diagnosis not present

## 2021-09-01 DIAGNOSIS — H35033 Hypertensive retinopathy, bilateral: Secondary | ICD-10-CM | POA: Diagnosis not present

## 2021-12-08 DIAGNOSIS — Z5181 Encounter for therapeutic drug level monitoring: Secondary | ICD-10-CM | POA: Diagnosis not present

## 2021-12-08 DIAGNOSIS — Z7952 Long term (current) use of systemic steroids: Secondary | ICD-10-CM | POA: Diagnosis not present

## 2021-12-08 DIAGNOSIS — L080 Pyoderma: Secondary | ICD-10-CM | POA: Diagnosis not present

## 2021-12-08 DIAGNOSIS — Z79631 Long term (current) use of antimetabolite agent: Secondary | ICD-10-CM | POA: Diagnosis not present

## 2021-12-08 DIAGNOSIS — L309 Dermatitis, unspecified: Secondary | ICD-10-CM | POA: Diagnosis not present

## 2022-01-05 DIAGNOSIS — E119 Type 2 diabetes mellitus without complications: Secondary | ICD-10-CM | POA: Diagnosis not present

## 2022-05-12 DIAGNOSIS — E119 Type 2 diabetes mellitus without complications: Secondary | ICD-10-CM | POA: Diagnosis not present

## 2022-09-10 DIAGNOSIS — M8580 Other specified disorders of bone density and structure, unspecified site: Secondary | ICD-10-CM | POA: Diagnosis not present

## 2022-09-10 DIAGNOSIS — E785 Hyperlipidemia, unspecified: Secondary | ICD-10-CM | POA: Diagnosis not present

## 2022-09-10 DIAGNOSIS — Z6830 Body mass index (BMI) 30.0-30.9, adult: Secondary | ICD-10-CM | POA: Diagnosis not present

## 2022-09-10 DIAGNOSIS — E119 Type 2 diabetes mellitus without complications: Secondary | ICD-10-CM | POA: Diagnosis not present

## 2022-09-10 DIAGNOSIS — I10 Essential (primary) hypertension: Secondary | ICD-10-CM | POA: Diagnosis not present

## 2022-09-17 DIAGNOSIS — Z01419 Encounter for gynecological examination (general) (routine) without abnormal findings: Secondary | ICD-10-CM | POA: Diagnosis not present

## 2022-09-17 DIAGNOSIS — Z8742 Personal history of other diseases of the female genital tract: Secondary | ICD-10-CM | POA: Diagnosis not present

## 2022-09-17 DIAGNOSIS — N6310 Unspecified lump in the right breast, unspecified quadrant: Secondary | ICD-10-CM | POA: Diagnosis not present

## 2022-09-17 DIAGNOSIS — Z1213 Encounter for screening for malignant neoplasm of small intestine: Secondary | ICD-10-CM | POA: Diagnosis not present

## 2022-09-21 ENCOUNTER — Other Ambulatory Visit: Payer: Self-pay | Admitting: Obstetrics and Gynecology

## 2022-09-21 DIAGNOSIS — N6310 Unspecified lump in the right breast, unspecified quadrant: Secondary | ICD-10-CM

## 2022-09-29 ENCOUNTER — Other Ambulatory Visit: Payer: Self-pay | Admitting: Obstetrics and Gynecology

## 2022-09-29 DIAGNOSIS — M8580 Other specified disorders of bone density and structure, unspecified site: Secondary | ICD-10-CM

## 2022-10-08 ENCOUNTER — Other Ambulatory Visit: Payer: Self-pay

## 2022-10-08 ENCOUNTER — Ambulatory Visit
Admission: RE | Admit: 2022-10-08 | Discharge: 2022-10-08 | Disposition: A | Discharge status: Home / Self Care | Payer: BC Managed Care – PPO | Source: Ambulatory Visit | Attending: Obstetrics and Gynecology | Admitting: Obstetrics and Gynecology

## 2022-10-08 ENCOUNTER — Ambulatory Visit
Admission: RE | Admit: 2022-10-08 | Discharge: 2022-10-08 | Disposition: A | Discharge status: Home / Self Care | Payer: Self-pay | Source: Ambulatory Visit | Attending: Obstetrics and Gynecology | Admitting: Obstetrics and Gynecology

## 2022-10-08 DIAGNOSIS — N6489 Other specified disorders of breast: Secondary | ICD-10-CM | POA: Diagnosis not present

## 2022-10-08 DIAGNOSIS — N6310 Unspecified lump in the right breast, unspecified quadrant: Secondary | ICD-10-CM

## 2022-10-19 DIAGNOSIS — Z5181 Encounter for therapeutic drug level monitoring: Secondary | ICD-10-CM | POA: Diagnosis not present

## 2022-10-19 DIAGNOSIS — L308 Other specified dermatitis: Secondary | ICD-10-CM | POA: Diagnosis not present

## 2022-11-10 DIAGNOSIS — H35033 Hypertensive retinopathy, bilateral: Secondary | ICD-10-CM | POA: Diagnosis not present

## 2022-11-10 DIAGNOSIS — E119 Type 2 diabetes mellitus without complications: Secondary | ICD-10-CM | POA: Diagnosis not present

## 2023-01-12 DIAGNOSIS — I10 Essential (primary) hypertension: Secondary | ICD-10-CM | POA: Diagnosis not present

## 2023-01-12 DIAGNOSIS — E119 Type 2 diabetes mellitus without complications: Secondary | ICD-10-CM | POA: Diagnosis not present

## 2023-04-14 ENCOUNTER — Ambulatory Visit
Admission: RE | Admit: 2023-04-14 | Discharge: 2023-04-14 | Disposition: A | Discharge status: Home / Self Care | Payer: Self-pay | Source: Ambulatory Visit | Attending: Obstetrics and Gynecology | Admitting: Obstetrics and Gynecology

## 2023-04-14 DIAGNOSIS — N958 Other specified menopausal and perimenopausal disorders: Secondary | ICD-10-CM | POA: Diagnosis not present

## 2023-04-14 DIAGNOSIS — M8588 Other specified disorders of bone density and structure, other site: Secondary | ICD-10-CM | POA: Diagnosis not present

## 2023-04-14 DIAGNOSIS — M8580 Other specified disorders of bone density and structure, unspecified site: Secondary | ICD-10-CM

## 2023-04-14 DIAGNOSIS — E2839 Other primary ovarian failure: Secondary | ICD-10-CM | POA: Diagnosis not present

## 2023-06-10 DIAGNOSIS — I10 Essential (primary) hypertension: Secondary | ICD-10-CM | POA: Diagnosis not present

## 2023-06-10 DIAGNOSIS — E119 Type 2 diabetes mellitus without complications: Secondary | ICD-10-CM | POA: Diagnosis not present

## 2023-06-28 DIAGNOSIS — Z5181 Encounter for therapeutic drug level monitoring: Secondary | ICD-10-CM | POA: Diagnosis not present

## 2023-06-28 DIAGNOSIS — L308 Other specified dermatitis: Secondary | ICD-10-CM | POA: Diagnosis not present

## 2023-07-03 ENCOUNTER — Ambulatory Visit
Admission: EM | Admit: 2023-07-03 | Discharge: 2023-07-03 | Disposition: A | Discharge status: Home / Self Care | Payer: BC Managed Care – PPO

## 2023-07-03 DIAGNOSIS — J329 Chronic sinusitis, unspecified: Secondary | ICD-10-CM | POA: Diagnosis not present

## 2023-07-03 DIAGNOSIS — J40 Bronchitis, not specified as acute or chronic: Secondary | ICD-10-CM

## 2023-07-03 DIAGNOSIS — E663 Overweight: Secondary | ICD-10-CM | POA: Insufficient documentation

## 2023-07-03 MED ORDER — PREDNISONE 20 MG PO TABS: 40.0000 mg | ORAL_TABLET | Freq: Every day | ORAL | 0 refills | Status: AC

## 2023-07-03 MED ORDER — AZITHROMYCIN 250 MG PO TABS: 250.0000 mg | ORAL_TABLET | Freq: Every day | ORAL | 0 refills | Status: AC

## 2023-07-03 NOTE — ED Triage Notes (Signed)
"  For about 3 wks I have had body aches, intermittent sore throat, nasal and chest congestion, my husband has the same thing, my grand-daughter has a virus and ear infection, cough as well (productive)". No fever known.

## 2023-07-03 NOTE — ED Provider Notes (Signed)
 [facility]    CSN: [id number] Arrival date & time: 07/03/23  1302      History   Chief Complaint Chief Complaint  Patient presents with   Nasal Congestion   Cough   Generalized Body Aches    HPI Connie Gonzales is a 63 y.o. female.   Patient here today for evaluation of 3 weeks of sore throat, congestion, cough.  She reports her cough is productive.  She has not any fever.  She has had some bodyaches.  She is taking cough drops but no other medication.  The history is provided by the patient.  Cough Associated symptoms: sore throat   Associated symptoms: no chills, no ear pain, no eye discharge, no fever, no shortness of breath and no wheezing     Past Medical History:  Diagnosis Date   Allergy    Hx of migraines    every few months   Hyperlipidemia    Hypertension     Patient Active Problem List   Diagnosis Date Noted   Overweight with body mass index (BMI) 25.0-29.9 07/03/2023    Past Surgical History:  Procedure Laterality Date   COLON SURGERY  08/11/11   lap rt colectomy    cyst removal from leg  1998   INTRAUTERINE DEVICE INSERTION  2008   wisdom teeth removal  1979    OB History   No obstetric history on file.      Home Medications    Prior to Admission medications   Medication Sig Start Date End Date Taking? Authorizing Provider  atenolol  (TENORMIN ) 25 MG tablet Take 25 mg by mouth as directed.   Yes [provider]  azithromycin  (ZITHROMAX ) 250 MG tablet Take 1 tablet (250 mg total) by mouth daily. Take first 2 tablets together, then 1 every day until finished. 07/03/23  Yes Billy Asberry FALCON, PA-C  cetirizine (ZYRTEC) 10 MG tablet Take 10 mg by mouth daily. 08/28/19  Yes [provider]  Dulaglutide (TRULICITY) 0.75 MG/0.5ML SOAJ Inject 0.75 mg into the skin as directed. 07/20/19  Yes [provider]  Dulaglutide (TRULICITY) 1.5 MG/0.5ML SOAJ Inject 0.75 mLs into the skin as directed. 07/14/19  Yes [provider]  Dulaglutide (TRULICITY) 4.5 MG/0.5ML SOAJ Inject 0.5 mLs into the skin as directed. 05/15/21  Yes [provider]  DULoxetine (CYMBALTA) 20 MG capsule Take 20 mg by mouth 2 (two) times daily. 09/06/19  Yes [provider]  folic acid (FOLVITE) 1 MG tablet Take 1 mg by mouth daily. 11/11/19  Yes [provider]  glucose blood (TRUETEST TEST) test strip 1 each by Other route as needed. 01/31/14  Yes [provider]  hydrOXYzine  (ATARAX ) 50 MG tablet Take 50 mg by mouth every 8 (eight) hours as needed. 09/01/19  Yes [provider]  methotrexate (RHEUMATREX) 2.5 MG tablet Take 10 mg by mouth once a week. 02/23/22  Yes [provider]  OZEMPIC, 2 MG/DOSE, 8 MG/3ML SOPN Inject 2 mg into the skin as directed. 03/04/23  Yes [provider]  predniSONE  (DELTASONE ) 20 MG tablet Take 2 tablets (40 mg total) by mouth daily with breakfast for 5 days. 07/03/23 07/08/23 Yes Billy Asberry FALCON, PA-C  simvastatin (ZOCOR) 40 MG tablet Take 40 mg by mouth daily at 6 PM.   Yes [provider]  atenolol  (TENORMIN ) 25 MG tablet Take 25 mg by mouth daily.    [provider]  augmented betamethasone dipropionate (DIPROLENE-AF) 0.05 % cream Apply  1 Application topically as directed.    [provider]  Cephalexin 500 MG tablet Take 500 mg by mouth as directed.    [provider]  chlorhexidine (HIBICLENS) 4 % external liquid Apply 1 Application topically daily as needed.    [provider]  clindamycin (CLEOCIN T) 1 % external solution Apply 1 Application topically 2 (two) times daily.    [provider]  eletriptan  (RELPAX ) 20 MG tablet Take 20 mg by mouth as directed.    [provider]  eletriptan  (RELPAX ) 40 MG tablet One tablet by mouth as needed for migraine headache.  If the headache improves and then returns, dose may be repeated after 2 hours have elapsed since first dose (do not exceed 80  mg per day). may repeat in 2 hours if necessary    [provider]  fluocinonide (LIDEX) 0.05 % external solution Apply 1 Application topically 2 (two) times daily.    [provider]  hydrOXYzine  (ATARAX /VISTARIL ) 25 MG tablet Take 1 tablet (25 mg total) by mouth every 6 (six) hours. 12/11/12   Sonda Alm BROCKS, MD  metFORMIN (GLUCOPHAGE) 500 MG tablet Take 500 mg by mouth as directed.    [provider]  mupirocin ointment (BACTROBAN) 2 % Apply 1 Application topically daily.    [provider]  naproxen (NAPROSYN) 375 MG tablet Take 375 mg by mouth 2 (two) times daily with a meal.    [provider]  phentermine 15 MG capsule Take 15 mg by mouth every morning.    [provider]  phentermine 30 MG capsule Take 30 mg by mouth every morning.    [provider]  Phentermine-Topiramate (QSYMIA) 11.25-69 MG CP24 Qsymia 11.25 mg-69 mg capsule, extended release    [provider]  Phentermine-Topiramate (QSYMIA) 3.75-23 MG CP24     [provider]  polyethylene glycol-electrolytes (NULYTELY) 420 g solution Take 4,000 mLs by mouth once.    [provider]  simvastatin (ZOCOR) 40 MG tablet Take 40 mg by mouth every evening.    [provider]  sulfamethoxazole-trimethoprim (BACTRIM DS) 800-160 MG tablet Take 1 tablet by mouth as directed.    [provider]  triamcinolone  cream (KENALOG ) 0.1 % Apply topically 3 (three) times daily. 12/11/12   Sonda Alm BROCKS, MD  zolpidem (AMBIEN) 5 MG tablet Take 5 mg by mouth as directed.    [provider]    Family History Family History  Problem Relation Age of Onset   Heart disease Father    Heart disease Maternal Grandfather    Heart disease Paternal Grandmother    Heart disease Paternal Grandfather     Social History Social History   Tobacco Use   Smoking status: Never    Passive exposure: Never   Smokeless tobacco: Never  Vaping Use    Vaping status: Never Used  Substance Use Topics   Alcohol  use: Yes    Comment: occasionally   Drug use: No     Allergies   Clindamycin/lincomycin, Amoxicillin-pot clavulanate, and Doxycycline   Review of Systems Review of Systems  Constitutional:  Negative for chills and fever.  HENT:  Positive for congestion, sinus pressure and sore throat. Negative for ear pain.   Eyes:  Negative for discharge and redness.  Respiratory:  Positive for cough. Negative for shortness of breath and wheezing.   Gastrointestinal:  Negative for abdominal pain, diarrhea, nausea and vomiting.     Physical Exam Triage Vital Signs ED Triage  Vitals  Encounter Vitals Group     BP 07/03/23 1354 105/62     Systolic BP Percentile --      Diastolic BP Percentile --      Pulse Rate 07/03/23 1354 80     Resp 07/03/23 1354 18     Temp 07/03/23 1354 97.9 F (36.6 C)     Temp Source 07/03/23 1354 Oral     SpO2 07/03/23 1354 96 %     Weight 07/03/23 1352 167 lb (75.8 kg)     Height 07/03/23 1352 5' 4 (1.626 m)     Head Circumference --      Peak Flow --      Pain Score 07/03/23 1349 0     Pain Loc --      Pain Education --      Exclude from Growth Chart --    No data found.  Updated Vital Signs BP 105/62 (BP Location: Right Arm)   Pulse 80   Temp 97.9 F (36.6 C) (Oral)   Resp 18   Ht 5' 4 (1.626 m)   Wt 167 lb (75.8 kg)   LMP 08/06/2009   SpO2 96%   BMI 28.67 kg/m   Visual Acuity Right Eye Distance:   Left Eye Distance:   Bilateral Distance:    Right Eye Near:   Left Eye Near:    Bilateral Near:     Physical Exam Vitals and nursing note reviewed.  Constitutional:      General: She is not in acute distress.    Appearance: Normal appearance. She is not ill-appearing.  HENT:     Head: Normocephalic and atraumatic.     Right Ear: Tympanic membrane normal.     Left Ear: Tympanic membrane normal.     Nose: Congestion present.     Mouth/Throat:     Mouth: Mucous membranes are  moist.     Pharynx: No oropharyngeal exudate or posterior oropharyngeal erythema.  Eyes:     Conjunctiva/sclera: Conjunctivae normal.  Cardiovascular:     Rate and Rhythm: Normal rate and regular rhythm.     Heart sounds: Normal heart sounds. No murmur heard. Pulmonary:     Effort: Pulmonary effort is normal. No respiratory distress.     Breath sounds: Wheezing (rare, scattered) present. No rhonchi or rales.  Skin:    General: Skin is warm and dry.  Neurological:     Mental Status: She is alert.  Psychiatric:        Mood and Affect: Mood normal.        Thought Content: Thought content normal.      UC Treatments / Results  Labs (all labs ordered are listed, but only abnormal results are displayed) Labs Reviewed - No data to display  EKG   Radiology No results found.  Procedures Procedures (including critical care time)  Medications Ordered in UC Medications - No data to display  Initial Impression / Assessment and Plan / UC Course  I have reviewed the triage vital signs and the nursing notes.  Pertinent labs & imaging results that were available during my care of the patient were reviewed by me and considered in my medical decision making (see chart for details).    Suspect sinobronchitis given duration of symptoms.  Will treat with Z-Pak and steroid burst.  Encouraged follow-up if no gradual improvement with any further concerns.  Patient expresses understanding.  Final Clinical Impressions(s) / UC Diagnoses   Final diagnoses:  Sinobronchitis  Discharge Instructions   None    ED Prescriptions     Medication Sig Dispense Auth. Provider   predniSONE  (DELTASONE ) 20 MG tablet Take 2 tablets (40 mg total) by mouth daily with breakfast for 5 days. 10 tablet Billy Stabs F, PA-C   azithromycin  (ZITHROMAX ) 250 MG tablet Take 1 tablet (250 mg total) by mouth daily. Take first 2 tablets together, then 1 every day until finished. 6 tablet Billy Stabs FALCON, PA-C       PDMP not reviewed this encounter.   Billy Stabs FALCON, PA-C 07/03/23 1510

## 2023-09-21 ENCOUNTER — Other Ambulatory Visit: Payer: Self-pay | Admitting: Obstetrics and Gynecology

## 2023-09-21 DIAGNOSIS — Z1231 Encounter for screening mammogram for malignant neoplasm of breast: Secondary | ICD-10-CM

## 2023-10-19 DIAGNOSIS — E785 Hyperlipidemia, unspecified: Secondary | ICD-10-CM | POA: Diagnosis not present

## 2023-10-19 DIAGNOSIS — E119 Type 2 diabetes mellitus without complications: Secondary | ICD-10-CM | POA: Diagnosis not present

## 2023-10-19 DIAGNOSIS — M8580 Other specified disorders of bone density and structure, unspecified site: Secondary | ICD-10-CM | POA: Diagnosis not present

## 2023-10-22 ENCOUNTER — Ambulatory Visit
Admission: RE | Admit: 2023-10-22 | Discharge: 2023-10-22 | Disposition: A | Discharge status: Home / Self Care | Source: Ambulatory Visit | Attending: Obstetrics and Gynecology | Admitting: Obstetrics and Gynecology

## 2023-10-22 DIAGNOSIS — Z1231 Encounter for screening mammogram for malignant neoplasm of breast: Secondary | ICD-10-CM | POA: Diagnosis not present

## 2023-10-26 DIAGNOSIS — Z1339 Encounter for screening examination for other mental health and behavioral disorders: Secondary | ICD-10-CM | POA: Diagnosis not present

## 2023-10-26 DIAGNOSIS — R82998 Other abnormal findings in urine: Secondary | ICD-10-CM | POA: Diagnosis not present

## 2023-10-26 DIAGNOSIS — Z0000 Encounter for general adult medical examination without abnormal findings: Secondary | ICD-10-CM | POA: Diagnosis not present

## 2023-10-26 DIAGNOSIS — Z1331 Encounter for screening for depression: Secondary | ICD-10-CM | POA: Diagnosis not present

## 2023-10-26 DIAGNOSIS — I10 Essential (primary) hypertension: Secondary | ICD-10-CM | POA: Diagnosis not present

## 2023-10-26 DIAGNOSIS — E119 Type 2 diabetes mellitus without complications: Secondary | ICD-10-CM | POA: Diagnosis not present

## 2023-11-19 DIAGNOSIS — Z01419 Encounter for gynecological examination (general) (routine) without abnormal findings: Secondary | ICD-10-CM | POA: Diagnosis not present

## 2023-12-10 DIAGNOSIS — H53143 Visual discomfort, bilateral: Secondary | ICD-10-CM | POA: Diagnosis not present

## 2023-12-10 DIAGNOSIS — E119 Type 2 diabetes mellitus without complications: Secondary | ICD-10-CM | POA: Diagnosis not present

## 2024-01-05 DIAGNOSIS — Z09 Encounter for follow-up examination after completed treatment for conditions other than malignant neoplasm: Secondary | ICD-10-CM | POA: Diagnosis not present

## 2024-01-05 DIAGNOSIS — Z860101 Personal history of adenomatous and serrated colon polyps: Secondary | ICD-10-CM | POA: Diagnosis not present

## 2024-01-05 DIAGNOSIS — K649 Unspecified hemorrhoids: Secondary | ICD-10-CM | POA: Diagnosis not present

## 2024-01-05 DIAGNOSIS — Z980 Intestinal bypass and anastomosis status: Secondary | ICD-10-CM | POA: Diagnosis not present

## 2024-01-10 DIAGNOSIS — L308 Other specified dermatitis: Secondary | ICD-10-CM | POA: Diagnosis not present

## 2024-01-10 DIAGNOSIS — Z5181 Encounter for therapeutic drug level monitoring: Secondary | ICD-10-CM | POA: Diagnosis not present

## 2024-04-01 DIAGNOSIS — Z23 Encounter for immunization: Secondary | ICD-10-CM | POA: Diagnosis not present

## 2024-05-11 DIAGNOSIS — E119 Type 2 diabetes mellitus without complications: Secondary | ICD-10-CM | POA: Diagnosis not present

## 2024-05-11 DIAGNOSIS — I10 Essential (primary) hypertension: Secondary | ICD-10-CM | POA: Diagnosis not present
